# Patient Record
Sex: Female | Born: 1990 | Race: Black or African American | Hispanic: No | Smoking: Never smoker
Health system: Southern US, Community
[De-identification: ages and names within clinical notes are randomized; demographics above are authoritative.]

---

## 2016-10-24 HISTORY — PX: LAPAROSCOPIC UNILATERAL SALPINGECTOMY: SHX5934

## 2018-12-31 ENCOUNTER — Other Ambulatory Visit: Payer: Self-pay

## 2018-12-31 ENCOUNTER — Encounter: Payer: Self-pay | Admitting: Physician Assistant

## 2018-12-31 ENCOUNTER — Emergency Department
Admission: EM | Admit: 2018-12-31 | Discharge: 2019-01-01 | Disposition: A | Discharge status: Home / Self Care | Payer: Self-pay | Attending: Emergency Medicine | Admitting: Emergency Medicine

## 2018-12-31 DIAGNOSIS — M79604 Pain in right leg: Secondary | ICD-10-CM | POA: Insufficient documentation

## 2018-12-31 NOTE — ED Notes (Signed)
Patient reports yesterday around 5pm her left leg went numb.  Patient denies any injury.  Patient reports difficulty walking.  Patient states numbness began when she woke up from a nap.

## 2018-12-31 NOTE — ED Triage Notes (Addendum)
Pt in with co pain to right lower leg states from mid leg down to foot. STates no injury, also co tingling to all right toes. Denies any hx of the same. Pt ambulatory to triage, states pain worse when she walks. Pt in to anterior lower leg, states pain is relieved when she is sitting.

## 2019-01-01 ENCOUNTER — Emergency Department: Payer: Self-pay

## 2019-01-01 LAB — POCT PREGNANCY, URINE: Preg Test, Ur: NEGATIVE

## 2019-01-01 MED ORDER — IBUPROFEN 600 MG PO TABS
600.0000 mg | ORAL_TABLET | Freq: Once | ORAL | Status: AC
  Administered 2019-01-01: 600 mg via ORAL
  Filled 2019-01-01: qty 1

## 2019-01-01 MED ORDER — CYCLOBENZAPRINE HCL 10 MG PO TABS: 10.0000 mg | ORAL_TABLET | Freq: Three times a day (TID) | ORAL | 0 refills | Status: DC | PRN

## 2019-01-01 MED ORDER — IBUPROFEN 800 MG PO TABS: 800.0000 mg | ORAL_TABLET | Freq: Three times a day (TID) | ORAL | 0 refills | Status: DC | PRN

## 2019-01-01 MED ORDER — CYCLOBENZAPRINE HCL 10 MG PO TABS
10.0000 mg | ORAL_TABLET | Freq: Once | ORAL | Status: AC
  Administered 2019-01-01: 10 mg via ORAL
  Filled 2019-01-01: qty 1

## 2019-01-01 NOTE — ED Notes (Signed)
ED Provider at bedside. 

## 2019-01-01 NOTE — ED Provider Notes (Signed)
[facility] Emergency Department Provider Note    First MD Initiated Contact with Patient 12/31/18 2329     (approximate)  I have reviewed the triage vital signs and the nursing notes.   HISTORY  Chief Complaint Leg Pain    HPI Jeanne Tyler is a 28 y.o. female presents to the emergency department with nontraumatic right hip and leg pain patient states current pain score is 8 out of 10.  Patient states that pain is worse with ambulation.  Patient denies any swelling.  Patient denies any chest pain.  Patient denies any shortness of breath.  No known history of DVT or PE.  Patient denies any low back pain.  Patient denies any urine or bowel habit changes.  Patient denies any abdominal pain.        History reviewed. No pertinent past medical history.  There are no active problems to display for this patient.   History reviewed. No pertinent surgical history.  Prior to Admission medications   Medication Sig Start Date End Date Taking? Authorizing Provider  cyclobenzaprine (FLEXERIL) 10 MG tablet Take 1 tablet (10 mg total) by mouth 3 (three) times daily as needed. 01/01/19   Darci Current, MD  ibuprofen (ADVIL,MOTRIN) 800 MG tablet Take 1 tablet (800 mg total) by mouth every 8 (eight) hours as needed. 01/01/19   Darci Current, MD    Allergies Tramadol  No family history on file.  Social History Social History   Tobacco Use  . Smoking status: Not on file  Substance Use Topics  . Alcohol use: Not on file  . Drug use: Not on file    Review of Systems Constitutional: No fever/chills Eyes: No visual changes. ENT: No sore throat. Cardiovascular: Denies chest pain. Respiratory: Denies shortness of breath. Gastrointestinal: No abdominal pain.  No nausea, no vomiting.  No diarrhea.  No constipation. Genitourinary: Negative for dysuria. Musculoskeletal: Negative for neck pain.  Negative for back pain.  Positive for right hip/leg  pain Integumentary: Negative for rash. Neurological: Negative for headaches, focal weakness or numbness.   ____________________________________________   PHYSICAL EXAM:  VITAL SIGNS: ED Triage Vitals  Enc Vitals Group     BP 12/31/18 2118 115/77     Pulse Rate 12/31/18 2118 77     Resp 12/31/18 2118 20     Temp 12/31/18 2118 98 F (36.7 C)     Temp Source 12/31/18 2118 Oral     SpO2 12/31/18 2118 100 %     Weight 12/31/18 2119 58.5 kg (129 lb)     Height 12/31/18 2119 1.524 m (5')     Head Circumference --      Peak Flow --      Pain Score 12/31/18 2119 8     Pain Loc --      Pain Edu? --      Excl. in GC? --     Constitutional: Alert and oriented. Well appearing and in no acute distress. Eyes: Conjunctivae are normal.  Mouth/Throat: Mucous membranes are moist.  Oropharynx non-erythematous. Neck: No stridor.   Cardiovascular: Normal rate, regular rhythm. Good peripheral circulation. Grossly normal heart sounds. Respiratory: Normal respiratory effort.  No retractions. Lungs CTAB. Gastrointestinal: Soft and nontender. No distention.  Musculoskeletal: Right hip pain with palpation.  Pain to palpation anterior right leg and foot. Neurologic:  Normal speech and language. No gross focal neurologic deficits are appreciated.  Skin:  Skin is warm, dry and intact. No rash noted. Psychiatric:  Mood and affect are normal. Speech and behavior are normal.  _______________________  RADIOLOGY I, [place] N BROWN, personally viewed and evaluated these images (plain radiographs) as part of my medical decision making, as well as reviewing the written report by the radiologist.  ED MD interpretation: Ultrasound venous of right lower extremity revealed no evidence of DVT.  Right hip x-ray negative  Official radiology report(s): US Venous Img Lower Unilateral Right  Result Date: 01/01/2019 CLINICAL DATA:  Right leg pain EXAM: Right LOWER EXTREMITY VENOUS DOPPLER ULTRASOUND TECHNIQUE:  Gray-scale sonography with graded compression, as well as color Doppler and duplex ultrasound were performed to evaluate the lower extremity deep venous systems from the level of the common femoral vein and including the common femoral, femoral, profunda femoral, popliteal and calf veins including the posterior tibial, peroneal and gastrocnemius veins when visible. The superficial great saphenous vein was also interrogated. Spectral Doppler was utilized to evaluate flow at rest and with distal augmentation maneuvers in the common femoral, femoral and popliteal veins. COMPARISON:  None. FINDINGS: Contralateral Common Femoral Vein: Respiratory phasicity is normal and symmetric with the symptomatic side. No evidence of thrombus. Normal compressibility. Common Femoral Vein: No evidence of thrombus. Normal compressibility, respiratory phasicity and response to augmentation. Saphenofemoral Junction: No evidence of thrombus. Normal compressibility and flow on color Doppler imaging. Profunda Femoral Vein: No evidence of thrombus. Normal compressibility and flow on color Doppler imaging. Femoral Vein: No evidence of thrombus. Normal compressibility, respiratory phasicity and response to augmentation. Popliteal Vein: No evidence of thrombus. Normal compressibility, respiratory phasicity and response to augmentation. Calf Veins: No evidence of thrombus. Normal compressibility and flow on color Doppler imaging. IMPRESSION: No evidence of deep venous thrombosis. Electronically Signed   By: Jasmine Pang M.D.   On: 01/01/2019 01:14   Dg Hip Unilat W Or Wo Pelvis 2-3 Views Right  Result Date: 01/01/2019 CLINICAL DATA:  Right hip pain EXAM: DG HIP (WITH OR WITHOUT PELVIS) 2-3V RIGHT COMPARISON:  None. FINDINGS: There is no evidence of hip fracture or dislocation. There is no evidence of arthropathy or other focal bone abnormality. IMPRESSION: Negative. Electronically Signed   By: Jasmine Pang M.D.   On: 01/01/2019 01:27       Procedures   ____________________________________________   INITIAL IMPRESSION / MDM / ASSESSMENT AND PLAN / ED COURSE  As part of my medical decision making, I reviewed the following data within the electronic MEDICAL RECORD NUMBER  28 year old female presented with above-stated history and physical exam secondary to right hip/leg pain.  Ultrasound revealed no evidence of DVT x-ray likewise unremarkable consider possibility DVT, muscular strain, bursitis.  Patient given Flexeril and ibuprofen the emergency department with resolution of hip pain. ____________________________________________  FINAL CLINICAL IMPRESSION(S) / ED DIAGNOSES  Final diagnoses:  Right leg pain     MEDICATIONS GIVEN DURING THIS VISIT:  Medications  cyclobenzaprine (FLEXERIL) tablet 10 mg (10 mg Oral Given 01/01/19 0124)  ibuprofen (ADVIL,MOTRIN) tablet 600 mg (600 mg Oral Given 01/01/19 0124)     ED Discharge Orders         Ordered    ibuprofen (ADVIL,MOTRIN) 800 MG tablet  Every 8 hours PRN     01/01/19 0235    cyclobenzaprine (FLEXERIL) 10 MG tablet  3 times daily PRN     01/01/19 0235           Note:  This document was prepared using Dragon voice recognition software and may include unintentional dictation errors.   Darci Current,  MD 01/01/19 0981

## 2019-01-01 NOTE — ED Notes (Signed)
Patient transported to X-ray 

## 2019-09-06 ENCOUNTER — Other Ambulatory Visit: Payer: Self-pay

## 2019-09-06 ENCOUNTER — Emergency Department
Admission: EM | Admit: 2019-09-06 | Discharge: 2019-09-06 | Disposition: A | Discharge status: Home / Self Care | Payer: Medicaid Other | Attending: Emergency Medicine | Admitting: Emergency Medicine

## 2019-09-06 ENCOUNTER — Encounter: Payer: Self-pay | Admitting: Emergency Medicine

## 2019-09-06 DIAGNOSIS — O210 Mild hyperemesis gravidarum: Secondary | ICD-10-CM | POA: Diagnosis not present

## 2019-09-06 DIAGNOSIS — Z3A01 Less than 8 weeks gestation of pregnancy: Secondary | ICD-10-CM | POA: Diagnosis not present

## 2019-09-06 DIAGNOSIS — O219 Vomiting of pregnancy, unspecified: Secondary | ICD-10-CM | POA: Diagnosis present

## 2019-09-06 LAB — COMPREHENSIVE METABOLIC PANEL
ALT: 33 U/L (ref 0–44)
AST: 23 U/L (ref 15–41)
Albumin: 4.2 g/dL (ref 3.5–5.0)
Alkaline Phosphatase: 55 U/L (ref 38–126)
Anion gap: 8 (ref 5–15)
BUN: 11 mg/dL (ref 6–20)
CO2: 23 mmol/L (ref 22–32)
Calcium: 9 mg/dL (ref 8.9–10.3)
Chloride: 102 mmol/L (ref 98–111)
Creatinine, Ser: 0.72 mg/dL (ref 0.44–1.00)
GFR calc Af Amer: >60 mL/min (ref >60)
GFR calc non Af Amer: >60 mL/min (ref >60)
Glucose, Bld: 99 mg/dL (ref 70–99)
Potassium: 3.9 mmol/L (ref 3.5–5.1)
Sodium: 133 mmol/L — ABNORMAL LOW (ref 135–145)
Total Bilirubin: 0.9 mg/dL (ref 0.3–1.2)
Total Protein: 7.8 g/dL (ref 6.5–8.1)

## 2019-09-06 LAB — CBC
HCT: 36.6 % (ref 36.0–46.0)
Hemoglobin: 12.6 g/dL (ref 12.0–15.0)
MCH: 29.9 pg (ref 26.0–34.0)
MCHC: 34.4 g/dL (ref 30.0–36.0)
MCV: 86.7 fL (ref 80.0–100.0)
Platelets: 356 10*3/uL (ref 150–400)
RBC: 4.22 MIL/uL (ref 3.87–5.11)
RDW: 12.6 % (ref 11.5–15.5)
WBC: 9.1 10*3/uL (ref 4.0–10.5)
nRBC: 0 % (ref 0.0–0.2)

## 2019-09-06 LAB — URINALYSIS, COMPLETE (UACMP) WITH MICROSCOPIC
Bacteria, UA: NONE SEEN
Bilirubin Urine: NEGATIVE
Glucose, UA: NEGATIVE mg/dL
Hgb urine dipstick: NEGATIVE
Ketones, ur: 80 mg/dL — AB
Leukocytes,Ua: NEGATIVE
Nitrite: NEGATIVE
Protein, ur: 30 mg/dL — AB
Specific Gravity, Urine: 1.024 (ref 1.005–1.030)
pH: 7 (ref 5.0–8.0)

## 2019-09-06 MED ORDER — PROMETHAZINE HCL 25 MG/ML IJ SOLN
12.5000 mg | Freq: Once | INTRAMUSCULAR | Status: AC
  Administered 2019-09-06: 14:00:00 12.5 mg via INTRAVENOUS
  Filled 2019-09-06: qty 1

## 2019-09-06 MED ORDER — PROMETHAZINE HCL 12.5 MG RE SUPP: 12.5000 mg | Freq: Four times a day (QID) | RECTAL | 0 refills | Status: DC | PRN

## 2019-09-06 MED ORDER — SODIUM CHLORIDE 0.9 % IV SOLN
1000.0000 mL | Freq: Once | INTRAVENOUS | Status: AC
  Administered 2019-09-06: 14:00:00 1000 mL via INTRAVENOUS

## 2019-09-06 NOTE — ED Notes (Signed)
Pt to restroom. Will have sign for d/c paperwork once back.

## 2019-09-06 NOTE — ED Triage Notes (Signed)
Pt reports is [redacted] weeks pregnant and started vomiting yesterday. Pt reports this is her second pregnancy so she knows how it can get.

## 2019-09-06 NOTE — ED Notes (Signed)
EDP Kinner at bedside.  

## 2019-09-06 NOTE — ED Provider Notes (Signed)
[facility] Emergency Department Provider Note   ____________________________________________    I have reviewed the triage vital signs and the nursing notes.   HISTORY  Chief Complaint Emesis During Pregnancy and Emesis     HPI Jeanne Tyler is a 28 y.o. female who reports she is approximately [redacted] weeks pregnant and presents with complaints of nausea and vomiting.  She reports she had significant morning sickness with her first pregnancy which required Phenergan suppositories and IV fluids at times.  She denies abdominal pain.  No vaginal bleeding.  No fevers or chills.  No myalgias.  Complains of nausea and difficulty tolerating p.o.'s.  No dizziness or lightheadedness.  She does not yet know who she will see if her OB  History reviewed. No pertinent past medical history.  There are no active problems to display for this patient.   History reviewed. No pertinent surgical history.  Prior to Admission medications   Medication Sig Start Date End Date Taking? Authorizing Provider  cyclobenzaprine (FLEXERIL) 10 MG tablet Take 1 tablet (10 mg total) by mouth 3 (three) times daily as needed. 01/01/19   Darci Current, MD  ibuprofen (ADVIL,MOTRIN) 800 MG tablet Take 1 tablet (800 mg total) by mouth every 8 (eight) hours as needed. 01/01/19   Darci Current, MD     Allergies Tramadol  No family history on file.  Social History Denies drugs or alcohol Review of Systems  Constitutional: No fever/chills Eyes: No visual changes.  ENT: No sore throat. Cardiovascular: Denies chest pain. Respiratory: Denies shortness of breath. Gastrointestinal: As above Genitourinary: As Musculoskeletal: Negative for back pain. Skin: Negative for rash. Neurological: Negative for headaches or weakness   ____________________________________________   PHYSICAL EXAM:  VITAL SIGNS: ED Triage Vitals  Enc Vitals Group     BP 09/06/19 1258 98/61     Pulse Rate  09/06/19 1258 79     Resp 09/06/19 1258 16     Temp 09/06/19 1258 99.7 F (37.6 C)     Temp Source 09/06/19 1258 Oral     SpO2 09/06/19 1258 100 %     Weight 09/06/19 1249 59.9 kg (132 lb)     Height 09/06/19 1249 1.524 m (5')     Head Circumference --      Peak Flow --      Pain Score 09/06/19 1249 0     Pain Loc --      Pain Edu? --      Excl. in GC? --     Constitutional: Alert and oriented.   Nose: No congestion/rhinnorhea. Mouth/Throat: Mucous membranes are dry  Cardiovascular: Normal rate, regular rhythm. Grossly normal heart sounds.  Good peripheral circulation. Respiratory: Normal respiratory effort.  No retractions. Lungs CTAB. Gastrointestinal: Soft and nontender. No distention.  No CVA tenderness.  Musculoskeletal:  Warm and well perfused Neurologic:  Normal speech and language. No gross focal neurologic deficits are appreciated.  Skin:  Skin is warm, dry and intact. No rash noted. Psychiatric: Mood and affect are normal. Speech and behavior are normal.  ____________________________________________   LABS (all labs ordered are listed, but only abnormal results are displayed)  Labs Reviewed  COMPREHENSIVE METABOLIC PANEL - Abnormal; Notable for the following components:      Result Value   Sodium 133 (*)    All other components within normal limits  URINALYSIS, COMPLETE (UACMP) WITH MICROSCOPIC - Abnormal; Notable for the following components:   Color, Urine YELLOW (*)  APPearance HAZY (*)    Ketones, ur 80 (*)    Protein, ur 30 (*)    All other components within normal limits  CBC   ____________________________________________  EKG  None ____________________________________________  RADIOLOGY  None ____________________________________________   PROCEDURES  Procedure(s) performed: No  Procedures   Critical Care performed: No ____________________________________________   INITIAL IMPRESSION / ASSESSMENT AND PLAN / ED COURSE   Pertinent labs & imaging results that were available during my care of the patient were reviewed by me and considered in my medical decision making (see chart for details).  Patient presents with nausea and vomiting in early pregnancy consistent with morning sickness.  Electrolytes are overall reassuring, we will treat with IV fluids, IV Phenergan.  No indication for imaging at this time.  Overall reassuring exam.  Anticipate discharge with Phenergan suppositories and outpatient follow-up with GYN    ____________________________________________   FINAL CLINICAL IMPRESSION(S) / ED DIAGNOSES  Final diagnoses:  Morning sickness        Note:  This document was prepared using Dragon voice recognition software and may include unintentional dictation errors.   Lavonia Drafts, MD 09/06/19 1350

## 2019-09-06 NOTE — ED Notes (Signed)
Pt accidentally signed under this RN's witness signature spot.

## 2019-09-06 NOTE — ED Provider Notes (Signed)
Patient resting comfortably, no distress.  Completing IV fluids, appears well ambulatory without distress.  Appropriate for discharge, provided prescription for Phenergan which she is utilized in the past   Delman Kitten, MD 09/06/19 1515

## 2019-10-03 ENCOUNTER — Encounter
Admission: EM | Disposition: A | Discharge status: Home / Self Care | Payer: Self-pay | Source: Home / Self Care | Attending: Emergency Medicine

## 2019-10-03 ENCOUNTER — Emergency Department: Payer: Medicaid Other | Admitting: Certified Registered"

## 2019-10-03 ENCOUNTER — Emergency Department: Payer: Medicaid Other

## 2019-10-03 ENCOUNTER — Encounter: Payer: Self-pay | Admitting: Intensive Care

## 2019-10-03 ENCOUNTER — Ambulatory Visit
Admission: EM | Admit: 2019-10-03 | Discharge: 2019-10-03 | Disposition: A | Discharge status: Home / Self Care | Payer: Medicaid Other | Attending: Emergency Medicine | Admitting: Emergency Medicine

## 2019-10-03 ENCOUNTER — Other Ambulatory Visit: Payer: Self-pay

## 2019-10-03 DIAGNOSIS — N8311 Corpus luteum cyst of right ovary: Secondary | ICD-10-CM | POA: Insufficient documentation

## 2019-10-03 DIAGNOSIS — O008 Other ectopic pregnancy without intrauterine pregnancy: Secondary | ICD-10-CM | POA: Diagnosis not present

## 2019-10-03 DIAGNOSIS — K661 Hemoperitoneum: Secondary | ICD-10-CM | POA: Insufficient documentation

## 2019-10-03 DIAGNOSIS — Z79899 Other long term (current) drug therapy: Secondary | ICD-10-CM | POA: Insufficient documentation

## 2019-10-03 DIAGNOSIS — O00109 Unspecified tubal pregnancy without intrauterine pregnancy: Secondary | ICD-10-CM | POA: Insufficient documentation

## 2019-10-03 DIAGNOSIS — Z3A09 9 weeks gestation of pregnancy: Secondary | ICD-10-CM | POA: Insufficient documentation

## 2019-10-03 DIAGNOSIS — R109 Unspecified abdominal pain: Secondary | ICD-10-CM | POA: Diagnosis present

## 2019-10-03 DIAGNOSIS — O26891 Other specified pregnancy related conditions, first trimester: Secondary | ICD-10-CM

## 2019-10-03 DIAGNOSIS — O3481 Maternal care for other abnormalities of pelvic organs, first trimester: Secondary | ICD-10-CM | POA: Diagnosis not present

## 2019-10-03 DIAGNOSIS — O009 Unspecified ectopic pregnancy without intrauterine pregnancy: Secondary | ICD-10-CM

## 2019-10-03 DIAGNOSIS — Z20828 Contact with and (suspected) exposure to other viral communicable diseases: Secondary | ICD-10-CM | POA: Diagnosis not present

## 2019-10-03 DIAGNOSIS — O00101 Right tubal pregnancy without intrauterine pregnancy: Secondary | ICD-10-CM | POA: Insufficient documentation

## 2019-10-03 DIAGNOSIS — Z9889 Other specified postprocedural states: Secondary | ICD-10-CM

## 2019-10-03 HISTORY — PX: LAPAROSCOPY: SHX197

## 2019-10-03 HISTORY — PX: LAPAROSCOPIC UNILATERAL SALPINGECTOMY: SHX5934

## 2019-10-03 LAB — COMPREHENSIVE METABOLIC PANEL
ALT: 12 U/L (ref 0–44)
AST: 17 U/L (ref 15–41)
Albumin: 3.4 g/dL — ABNORMAL LOW (ref 3.5–5.0)
Alkaline Phosphatase: 41 U/L (ref 38–126)
Anion gap: 9 (ref 5–15)
BUN: 15 mg/dL (ref 6–20)
CO2: 23 mmol/L (ref 22–32)
Calcium: 8.7 mg/dL — ABNORMAL LOW (ref 8.9–10.3)
Chloride: 103 mmol/L (ref 98–111)
Creatinine, Ser: 0.66 mg/dL (ref 0.44–1.00)
GFR calc Af Amer: >60 mL/min (ref >60)
GFR calc non Af Amer: >60 mL/min (ref >60)
Glucose, Bld: 129 mg/dL — ABNORMAL HIGH (ref 70–99)
Potassium: 3.5 mmol/L (ref 3.5–5.1)
Sodium: 135 mmol/L (ref 135–145)
Total Bilirubin: 0.8 mg/dL (ref 0.3–1.2)
Total Protein: 7 g/dL (ref 6.5–8.1)

## 2019-10-03 LAB — CBC WITH DIFFERENTIAL/PLATELET
Abs Immature Granulocytes: 0.1 10*3/uL — ABNORMAL HIGH (ref 0.00–0.07)
Basophils Absolute: 0 10*3/uL (ref 0.0–0.1)
Basophils Relative: 0 %
Eosinophils Absolute: 0 10*3/uL (ref 0.0–0.5)
Eosinophils Relative: 0 %
HCT: 32 % — ABNORMAL LOW (ref 36.0–46.0)
Hemoglobin: 11.3 g/dL — ABNORMAL LOW (ref 12.0–15.0)
Immature Granulocytes: 1 %
Lymphocytes Relative: 15 %
Lymphs Abs: 2.7 10*3/uL (ref 0.7–4.0)
MCH: 30 pg (ref 26.0–34.0)
MCHC: 35.3 g/dL (ref 30.0–36.0)
MCV: 84.9 fL (ref 80.0–100.0)
Monocytes Absolute: 0.9 10*3/uL (ref 0.1–1.0)
Monocytes Relative: 5 %
Neutro Abs: 14.3 10*3/uL — ABNORMAL HIGH (ref 1.7–7.7)
Neutrophils Relative %: 79 %
Platelets: 377 10*3/uL (ref 150–400)
RBC: 3.77 MIL/uL — ABNORMAL LOW (ref 3.87–5.11)
RDW: 12.3 % (ref 11.5–15.5)
WBC: 18 10*3/uL — ABNORMAL HIGH (ref 4.0–10.5)
nRBC: 0 % (ref 0.0–0.2)

## 2019-10-03 LAB — SAMPLE TO BLOOD BANK

## 2019-10-03 LAB — RESPIRATORY PANEL BY RT PCR (FLU A&B, COVID)
Influenza A by PCR: NEGATIVE
Influenza B by PCR: NEGATIVE
SARS Coronavirus 2 by RT PCR: NEGATIVE

## 2019-10-03 LAB — ABO/RH: ABO/RH(D): B NEG

## 2019-10-03 LAB — HCG, QUANTITATIVE, PREGNANCY: hCG, Beta Chain, Quant, S: 183918 m[IU]/mL — ABNORMAL HIGH (ref <5)

## 2019-10-03 SURGERY — LAPAROSCOPY, DIAGNOSTIC
Anesthesia: General | Site: Abdomen | Laterality: Right

## 2019-10-03 MED ORDER — ONDANSETRON HCL 4 MG/2ML IJ SOLN
INTRAMUSCULAR | Status: DC | PRN
  Administered 2019-10-03: 4 mg via INTRAVENOUS

## 2019-10-03 MED ORDER — OXYCODONE-ACETAMINOPHEN 5-325 MG PO TABS: 1.0000 | ORAL_TABLET | ORAL | 0 refills | Status: DC | PRN

## 2019-10-03 MED ORDER — FENTANYL CITRATE (PF) 100 MCG/2ML IJ SOLN
INTRAMUSCULAR | Status: DC | PRN
  Administered 2019-10-03 (×2): 50 ug via INTRAVENOUS

## 2019-10-03 MED ORDER — MORPHINE SULFATE (PF) 4 MG/ML IV SOLN
4.0000 mg | Freq: Once | INTRAVENOUS | Status: AC
  Administered 2019-10-03: 4 mg via INTRAVENOUS
  Filled 2019-10-03: qty 1

## 2019-10-03 MED ORDER — OXYCODONE-ACETAMINOPHEN 5-325 MG PO TABS
ORAL_TABLET | ORAL | Status: AC
  Administered 2019-10-03: 1 via ORAL
  Filled 2019-10-03: qty 1

## 2019-10-03 MED ORDER — ONDANSETRON HCL 4 MG/2ML IJ SOLN
INTRAMUSCULAR | Status: AC
  Administered 2019-10-03: 4 mg via INTRAVENOUS
  Filled 2019-10-03: qty 2

## 2019-10-03 MED ORDER — PROPOFOL 10 MG/ML IV BOLUS
INTRAVENOUS | Status: DC | PRN
  Administered 2019-10-03: 150 mg via INTRAVENOUS

## 2019-10-03 MED ORDER — HEMOSTATIC AGENTS (NO CHARGE) OPTIME
TOPICAL | Status: DC | PRN
  Administered 2019-10-03: 1

## 2019-10-03 MED ORDER — MIDAZOLAM HCL 2 MG/2ML IJ SOLN
INTRAMUSCULAR | Status: DC | PRN
  Administered 2019-10-03: 2 mg via INTRAVENOUS

## 2019-10-03 MED ORDER — RHO D IMMUNE GLOBULIN 1500 UNIT/2ML IJ SOSY
300.0000 ug | PREFILLED_SYRINGE | Freq: Once | INTRAMUSCULAR | Status: AC
  Administered 2019-10-03: 300 ug via INTRAMUSCULAR
  Filled 2019-10-03: qty 2

## 2019-10-03 MED ORDER — SODIUM CHLORIDE 0.9% IV SOLUTION
Freq: Once | INTRAVENOUS | Status: DC
  Filled 2019-10-03: qty 250

## 2019-10-03 MED ORDER — LACTATED RINGERS IV SOLN
INTRAVENOUS | Status: DC | PRN
  Administered 2019-10-03: 18:00:00 via INTRAVENOUS

## 2019-10-03 MED ORDER — PHENYLEPHRINE HCL (PRESSORS) 10 MG/ML IV SOLN
INTRAVENOUS | Status: DC | PRN
  Administered 2019-10-03 (×2): 100 ug via INTRAVENOUS

## 2019-10-03 MED ORDER — ACETAMINOPHEN 10 MG/ML IV SOLN
INTRAVENOUS | Status: DC | PRN
  Administered 2019-10-03: 1000 mg via INTRAVENOUS

## 2019-10-03 MED ORDER — SUGAMMADEX SODIUM 200 MG/2ML IV SOLN
INTRAVENOUS | Status: DC | PRN
  Administered 2019-10-03: 130 mg via INTRAVENOUS

## 2019-10-03 MED ORDER — FENTANYL CITRATE (PF) 100 MCG/2ML IJ SOLN
25.0000 ug | INTRAMUSCULAR | Status: DC | PRN
  Administered 2019-10-03 (×4): 25 ug via INTRAVENOUS

## 2019-10-03 MED ORDER — OXYCODONE-ACETAMINOPHEN 5-325 MG PO TABS: 1.0000 | ORAL_TABLET | ORAL | Status: DC | PRN

## 2019-10-03 MED ORDER — LIDOCAINE HCL (CARDIAC) PF 100 MG/5ML IV SOSY
PREFILLED_SYRINGE | INTRAVENOUS | Status: DC | PRN
  Administered 2019-10-03: 100 mg via INTRAVENOUS

## 2019-10-03 MED ORDER — ROCURONIUM BROMIDE 100 MG/10ML IV SOLN
INTRAVENOUS | Status: DC | PRN
  Administered 2019-10-03: 40 mg via INTRAVENOUS

## 2019-10-03 MED ORDER — BUPIVACAINE HCL 0.5 % IJ SOLN
INTRAMUSCULAR | Status: DC | PRN
  Administered 2019-10-03: 5 mL
  Administered 2019-10-03: 9 mL

## 2019-10-03 MED ORDER — SODIUM CHLORIDE 0.9 % IV SOLN
1000.0000 mL | Freq: Once | INTRAVENOUS | Status: AC
  Administered 2019-10-03: 1000 mL via INTRAVENOUS

## 2019-10-03 MED ORDER — DEXAMETHASONE SODIUM PHOSPHATE 10 MG/ML IJ SOLN
INTRAMUSCULAR | Status: DC | PRN
  Administered 2019-10-03: 10 mg via INTRAVENOUS

## 2019-10-03 MED ORDER — ONDANSETRON HCL 4 MG/2ML IJ SOLN
4.0000 mg | Freq: Once | INTRAMUSCULAR | Status: AC
  Administered 2019-10-03: 4 mg via INTRAVENOUS
  Filled 2019-10-03: qty 2

## 2019-10-03 MED ORDER — MORPHINE SULFATE (PF) 4 MG/ML IV SOLN
4.0000 mg | Freq: Once | INTRAVENOUS | Status: AC
  Administered 2019-10-03: 2 mg via INTRAVENOUS
  Filled 2019-10-03: qty 1

## 2019-10-03 MED ORDER — ONDANSETRON HCL 4 MG/2ML IJ SOLN: 4.0000 mg | Freq: Once | INTRAMUSCULAR | Status: AC | PRN

## 2019-10-03 SURGICAL SUPPLY — 40 items
ANCHOR TIS RET SYS 235ML (MISCELLANEOUS) ×4 IMPLANT
BLADE SURG SZ11 CARB STEEL (BLADE) ×4 IMPLANT
CANISTER SUCT 1200ML W/VALVE (MISCELLANEOUS) ×4 IMPLANT
CATH ROBINSON RED A/P 16FR (CATHETERS) ×4 IMPLANT
CHLORAPREP W/TINT 26 (MISCELLANEOUS) ×4 IMPLANT
COVER WAND RF STERILE (DRAPES) ×8 IMPLANT
DERMABOND ADVANCED (GAUZE/BANDAGES/DRESSINGS) ×2
DERMABOND ADVANCED .7 DNX12 (GAUZE/BANDAGES/DRESSINGS) ×2 IMPLANT
DEVICE SUTURE ENDOST 10MM (ENDOMECHANICALS) ×4 IMPLANT
DRESSING SURGICEL FIBRLLR 1X2 (HEMOSTASIS) ×2 IMPLANT
DRSG SURGICEL FIBRILLAR 1X2 (HEMOSTASIS) ×4
DRSG TEGADERM 2-3/8X2-3/4 SM (GAUZE/BANDAGES/DRESSINGS) ×4 IMPLANT
GLOVE BIO SURGEON STRL SZ7 (GLOVE) ×4 IMPLANT
GLOVE INDICATOR 7.5 STRL GRN (GLOVE) ×4 IMPLANT
GOWN STRL REUS W/ TWL LRG LVL3 (GOWN DISPOSABLE) ×4 IMPLANT
GOWN STRL REUS W/TWL LRG LVL3 (GOWN DISPOSABLE) ×4
GRASPER SUT TROCAR 14GX15 (MISCELLANEOUS) ×4 IMPLANT
IRRIGATION STRYKERFLOW (MISCELLANEOUS) ×2 IMPLANT
IRRIGATOR STRYKERFLOW (MISCELLANEOUS) ×4
IV LACTATED RINGERS 1000ML (IV SOLUTION) ×4 IMPLANT
KIT PINK PAD W/HEAD ARE REST (MISCELLANEOUS) ×4
KIT PINK PAD W/HEAD ARM REST (MISCELLANEOUS) ×2 IMPLANT
KIT TURNOVER CYSTO (KITS) ×4 IMPLANT
LABEL OR SOLS (LABEL) ×4 IMPLANT
NS IRRIG 500ML POUR BTL (IV SOLUTION) ×4 IMPLANT
PACK GYN LAPAROSCOPIC (MISCELLANEOUS) ×4 IMPLANT
PAD OB MATERNITY 4.3X12.25 (PERSONAL CARE ITEMS) ×4 IMPLANT
PAD PREP 24X41 OB/GYN DISP (PERSONAL CARE ITEMS) ×4 IMPLANT
SCISSORS METZENBAUM CVD 33 (INSTRUMENTS) IMPLANT
SET TUBE SMOKE EVAC HIGH FLOW (TUBING) IMPLANT
SHEARS HARMONIC ACE PLUS 36CM (ENDOMECHANICALS) ×4 IMPLANT
SLEEVE ENDOPATH XCEL 5M (ENDOMECHANICALS) ×4 IMPLANT
SPONGE GAUZE 2X2 8PLY STER LF (GAUZE/BANDAGES/DRESSINGS) ×1
SPONGE GAUZE 2X2 8PLY STRL LF (GAUZE/BANDAGES/DRESSINGS) ×3 IMPLANT
SUT ENDO VLOC 180-0-8IN (SUTURE) ×4 IMPLANT
SUT MNCRL AB 4-0 PS2 18 (SUTURE) ×4 IMPLANT
SUT VIC AB 0 CT2 27 (SUTURE) ×4 IMPLANT
SYR 10ML LL (SYRINGE) ×4 IMPLANT
TROCAR ENDO BLADELESS 11MM (ENDOMECHANICALS) ×4 IMPLANT
TROCAR XCEL NON-BLD 5MMX100MML (ENDOMECHANICALS) ×4 IMPLANT

## 2019-10-03 NOTE — Discharge Instructions (Signed)

## 2019-10-03 NOTE — ED Notes (Signed)
Pt already removed all jewelry earlier but reminded to remove it again if any replaced. Pt's belongings in bag in bed with her. Pt in gown only with one blanket. Pt on phone with family again. Transport at bedside to take pt to OR. Given blank consent form and pt stickers.

## 2019-10-03 NOTE — ED Notes (Signed)
Pt agreeable to go to room 41 with Amber, RN at this time. Another family member noted to be angry in the lobby stating, "we will put her back on the ambulance and take her to my hospital..." Pt taken to room 41 by [employer], RN.

## 2019-10-03 NOTE — Op Note (Signed)
Preoperative Diagnosis: 1) 28 y.o. 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 G1P0  withpresenting with abdominal pain at [redacted] weeks gestation.  MRI showing hemoperitoneum and heterotopic pregnancy  Surgeon: Malachy Mood, MD  Assistant: Prentice Docker, MD this surgery required a high level surgical assistant with none other readily available  Anesthesia: General  Preoperative Antibiotics: none  Estimated Blood Loss: 500 mL  IV Fluids: 1L  Urine Output:: 285mL  Drains or Tubes: none  Implants: none  Specimens Removed: none  Complications: none  Intraoperative Findings: Absent left tube, normal left ovary.  Uterus enlarged consistent with [redacted] weeks gestation.  The right cornual portion of the tube contained a large ruptured ectopic pregnancy.  The ectopic pregnancy was primarily within the proximal portion of the tube but some myometrium had to be excised to fully remove the ectopic pregnancy intact.  The right ovary contained a corpus luteum cyst.  The distal portion of the tube was left in place should the intrauterine pregnancy fail and the patient express interest in attempting reanastomosis.      Patient Condition: stable  Procedure in Detail:  Patient was taken to the operating room where she was administered general anesthesia.  She was positioned in the dorsal lithotomy position utilizing Allen stirups, prepped and draped in the usual sterile fashion.  Prior to proceeding with procedure a time out was performed.  Attention was turned to the patient's pelvis.  A red rubber catheter was used to empty the patient's bladder.    Attention was turned to the patient's abdomen.  The umbilicus was infiltrated with 1% Sensorcaine, before making a stab incision using  an 11 blade scalpel.  A 75mm Excel trocar was then used to gain direct entry into the peritoneal cavity utilizing the camera to visualize progress of the trocar during placement.  Once peritoneal entry had been achieved, insufflation was started and pneumoperitoneum established at a pressure of 40mmHg. Right and left lower quadrant 56mm  trocars were placed under direct visualization and the umbilical trocar was stepped up to an 54mm trocar. General inspection of the abdomen revealed the above noted findings.   Hemoperitoneum was evacuated from the lower and upper abdomen yielding approximately 552mL.  The cornual portion of the ectopic was carefully excised using a 68mm Harmonic scalpel.  Vasopressin was not utilized given the other viable intrauterine pregnancy.  The portion of tubes containing the ectopic was excised from its attachments to the uterus, mesosalpinx, and distal tube, placed in an endocatch bag and removed.  A running locked 0 V-lock suture was used to oversow the uterine cornua.  The pelvis was irrigated noted to be hemostatic  Fibrillar was applied to the cornual pedicle.The 79mm trocar site was closed using a 0 Vicryl and carter thompson.    Pneumoperitoneum was evacuated.  The trocars were removed.  The 64mm trocar site was closed with 4-0 Monocryl in a subcuticular fashion.  All trocar sites were then dressed with surgical skin glue.   Sponge needle and instrument counts were correct time two.  The patient tolerated the procedure well and was taken to the recovery room in stable condition.

## 2019-10-03 NOTE — Anesthesia Procedure Notes (Signed)
Procedure Name: Intubation Date/Time: 10/03/2019 6:10 PM Performed by: Aline Brochure, CRNA Pre-anesthesia Checklist: Patient identified, Emergency Drugs available, Suction available and Patient being monitored Patient Re-evaluated:Patient Re-evaluated prior to induction Oxygen Delivery Method: Circle system utilized Preoxygenation: Pre-oxygenation with 100% oxygen Induction Type: IV induction Ventilation: Mask ventilation without difficulty Laryngoscope Size: Mac and 3 Grade View: Grade II Tube type: Oral Tube size: 7.0 mm Number of attempts: 1 Airway Equipment and Method: Stylet Placement Confirmation: ETT inserted through vocal cords under direct vision,  positive ETCO2 and breath sounds checked- equal and bilateral Secured at: 20 cm Tube secured with: Tape Dental Injury: Teeth and Oropharynx as per pre-operative assessment

## 2019-10-03 NOTE — Transfer of Care (Signed)
Immediate Anesthesia Transfer of Care Note  Patient: Jeanne Tyler  Procedure(s) Performed: LAPAROSCOPY DIAGNOSTIC (N/A Abdomen) LAPAROSCOPIC UNILATERAL SALPINGECTOMY (Right Abdomen)  Patient Location: PACU  Anesthesia Type:General  Level of Consciousness: awake  Airway & Oxygen Therapy: Patient connected to face mask oxygen  Post-op Assessment: Post -op Vital signs reviewed and stable  Post vital signs: stable  Last Vitals:  Vitals Value Taken Time  BP 120/90 10/03/19 1941  Temp    Pulse 103 10/03/19 1942  Resp 20 10/03/19 1942  SpO2 100 % 10/03/19 1942  Vitals shown include unvalidated device data.  Last Pain:  Vitals:   10/03/19 1613  TempSrc:   PainSc: 9          Complications: No apparent anesthesia complications

## 2019-10-03 NOTE — ED Notes (Signed)
EDP Williams at bedside updating pt.

## 2019-10-03 NOTE — ED Notes (Signed)
Extra pink and lav tubes sent to lab in case needed for Type and Screen. Covid swab collected and give to lab staff. Blood consent obtained. Pt called family to update them.

## 2019-10-03 NOTE — ED Notes (Signed)
First Nurse Note:  Pt in via [facility] from home; pt is approximately [redacted] weeks pregnant with complaints of lower abdominal pain, denies vaginal bleeding.  Report hx of Ectopic pregnancy.  BP 96/48 per EMS, other vitals WDL.  22G to right hand upon arrival.

## 2019-10-03 NOTE — ED Notes (Signed)
MRI staff at bedside. Pt reports inc pain again and is requesting more pain meds. Provider notified.

## 2019-10-03 NOTE — Anesthesia Preprocedure Evaluation (Signed)
Anesthesia Evaluation  Patient identified by MRN, date of birth, ID band Patient awake    Reviewed: Allergy & Precautions, NPO status , Patient's Chart, lab work & pertinent test results  History of Anesthesia Complications Negative for: history of anesthetic complications  Airway Mallampati: II       Dental   Pulmonary neg sleep apnea, neg COPD, Not current smoker,           Cardiovascular (-) hypertension(-) Past MI and (-) CHF (-) dysrhythmias (-) Valvular Problems/Murmurs     Neuro/Psych neg Seizures    GI/Hepatic Neg liver ROS, neg GERD  ,  Endo/Other  neg diabetes  Renal/GU negative Renal ROS     Musculoskeletal   Abdominal   Peds  Hematology   Anesthesia Other Findings   Reproductive/Obstetrics (+) Pregnancy                             Anesthesia Physical Anesthesia Plan  ASA: II and emergent  Anesthesia Plan: General   Post-op Pain Management:    Induction: Intravenous  PONV Risk Score and Plan: 3 and Dexamethasone, Ondansetron and Midazolam  Airway Management Planned: Oral ETT  Additional Equipment:   Intra-op Plan:   Post-operative Plan:   Informed Consent: I have reviewed the patients History and Physical, chart, labs and discussed the procedure including the risks, benefits and alternatives for the proposed anesthesia with the patient or authorized representative who has indicated his/her understanding and acceptance.       Plan Discussed with:   Anesthesia Plan Comments:         Anesthesia Quick Evaluation

## 2019-10-03 NOTE — ED Notes (Signed)
Pt off unit at imaging. Will give pain meds once back.

## 2019-10-03 NOTE — H&P (Signed)
Obstetrics & Gynecology Consult H&P   Consulting Department: Emergency Department  Consulting Physician: Daryel November, MD  Consulting Question: Ruptured heterotopic   History of Present Illness: Patient is a 28 y.o. G3P1011 with Patient's last menstrual period was 07/24/2019 (exact date). presenting with abdominal pain 24-hrs.  The patient has had problems with hyperemesis.  She threw up this morning and felt a sudden onset of right lower quadrant pain that steadily worsened.  The pain worried her for ectopic pregnancy.  Patient previously underwent term cesarean section 8 years ago, followed by left salpingectomy for ectopic pregnancy at [facility].    Initial ultrasound in ED revealed a viable intrauterine pregnancy at [redacted]w[redacted]d gestation,  No other concerning findings were noted at that time.  However, because of continued severe abdominal pain requiring IV antibiotics MRI pelvis was obtained showing large volume of hemoperitoneum and what appears to be cystic right adnexa likely representing a corpus luteum given gestational age, however inferior to this is a second mass appearing to contain a gestational sac and fetal part, separate from the IUP, tubal but potentially cornual.     Review of Systems:10 point review of systems  Past Medical History:  History reviewed. No pertinent past medical history.  Past Surgical History:  History reviewed. No pertinent surgical history.  Gynecologic History:   Obstetric History: G1P0  Family History:  History reviewed. No pertinent family history.  Social History:  Social History   Socioeconomic History  . Marital status: Single    Spouse name: Not on file  . Number of children: Not on file  . Years of education: Not on file  . Highest education level: Not on file  Occupational History  . Not on file  Tobacco Use  . Smoking status: Never Smoker  . Smokeless tobacco: Never Used  Substance and Sexual Activity  . Alcohol use: Not  Currently  . Drug use: Never  . Sexual activity: Not on file  Other Topics Concern  . Not on file  Social History Narrative  . Not on file   Social Determinants of Health   Financial Resource Strain:   . Difficulty of Paying Living Expenses: Not on file  Food Insecurity:   . Worried About Programme researcher, broadcasting/film/video in the Last Year: Not on file  . Ran Out of Food in the Last Year: Not on file  Transportation Needs:   . Lack of Transportation (Medical): Not on file  . Lack of Transportation (Non-Medical): Not on file  Physical Activity:   . Days of Exercise per Week: Not on file  . Minutes of Exercise per Session: Not on file  Stress:   . Feeling of Stress : Not on file  Social Connections:   . Frequency of Communication with Friends and Family: Not on file  . Frequency of Social Gatherings with Friends and Family: Not on file  . Attends Religious Services: Not on file  . Active Member of Clubs or Organizations: Not on file  . Attends Banker Meetings: Not on file  . Marital Status: Not on file  Intimate Partner Violence:   . Fear of Current or Ex-Partner: Not on file  . Emotionally Abused: Not on file  . Physically Abused: Not on file  . Sexually Abused: Not on file    Allergies:  Allergies  Allergen Reactions  . Tramadol Swelling    Medications: Prior to Admission medications   Medication Sig Start Date End Date Taking? Authorizing Provider  cyclobenzaprine (  FLEXERIL) 10 MG tablet Take 1 tablet (10 mg total) by mouth 3 (three) times daily as needed. Patient not taking: Reported on 10/03/2019 01/01/19   Darci Current, MD  DICLEGIS 10-10 MG TBEC Take 1-2 tablets by mouth at bedtime as needed.  09/27/19   [provider]  ibuprofen (ADVIL,MOTRIN) 800 MG tablet Take 1 tablet (800 mg total) by mouth every 8 (eight) hours as needed. Patient not taking: Reported on 10/03/2019 01/01/19   Darci Current, MD  promethazine (PHENERGAN) 12.5 MG suppository  Place 1 suppository (12.5 mg total) rectally every 6 (six) hours as needed for nausea or vomiting. 09/06/19   Jene Every, MD    Physical Exam Vitals: Blood pressure 108/66, pulse 71, temperature 98 F (36.7 C), temperature source Oral, resp. rate 16, height 5' (1.524 m), weight 61.2 kg, last menstrual period 07/24/2019, SpO2 100 %. General: NAD HEENT: normocephalic, anicteric Pulmonary: No increased work of breathing, CTAB Cardiovascular: RRR, distal pulses 2+ Abdomen: distended, diffusely tender, + rebound and guarding Genitourinary: deferred Extremities: no edema, erythema, or tenderness Neurologic: Grossly intact Psychiatric: mood appropriate, affect full  Labs: Results for orders placed or performed during the hospital encounter of 10/03/19 (from the past 72 hour(s))  hCG, quantitative, pregnancy     Status: Abnormal   Collection Time: 10/03/19 11:55 AM  Result Value Ref Range   hCG, Beta Chain, Quant, S 183,918 (H) <5 mIU/mL    Comment:          GEST. AGE      CONC.  (mIU/mL)   <=1 WEEK        5 - 50     2 WEEKS       50 - 500     3 WEEKS       100 - 10,000     4 WEEKS     1,000 - 30,000     5 WEEKS     3,500 - 115,000   6-8 WEEKS     12,000 - 270,000    12 WEEKS     15,000 - 220,000        FEMALE AND NON-PREGNANT FEMALE:     LESS THAN 5 mIU/mL Performed at [facility] [address] [place], [place] [address]   CBC with Differential     Status: Abnormal   Collection Time: 10/03/19 11:57 AM  Result Value Ref Range   WBC 18.0 (H) 4.0 - 10.5 K/uL   RBC 3.77 (L) 3.87 - 5.11 MIL/uL   Hemoglobin 11.3 (L) 12.0 - 15.0 g/dL   HCT 52.8 (L) 41.3 - 24.4 %   MCV 84.9 80.0 - 100.0 fL   MCH 30.0 26.0 - 34.0 pg   MCHC 35.3 30.0 - 36.0 g/dL   RDW 01.0 27.2 - 53.6 %   Platelets 377 150 - 400 K/uL   nRBC 0.0 0.0 - 0.2 %   Neutrophils Relative % 79 %   Neutro Abs 14.3 (H) 1.7 - 7.7 K/uL   Lymphocytes Relative 15 %   Lymphs Abs 2.7 0.7 - 4.0 K/uL   Monocytes  Relative 5 %   Monocytes Absolute 0.9 0.1 - 1.0 K/uL   Eosinophils Relative 0 %   Eosinophils Absolute 0.0 0.0 - 0.5 K/uL   Basophils Relative 0 %   Basophils Absolute 0.0 0.0 - 0.1 K/uL   Immature Granulocytes 1 %   Abs Immature Granulocytes 0.10 (H) 0.00 - 0.07 K/uL    Comment: Performed at [employer]  [facility], [address] [place], [place] [address]  Comprehensive metabolic panel     Status: Abnormal   Collection Time: 10/03/19 11:57 AM  Result Value Ref Range   Sodium 135 135 - 145 mmol/L   Potassium 3.5 3.5 - 5.1 mmol/L   Chloride 103 98 - 111 mmol/L   CO2 23 22 - 32 mmol/L   Glucose, Bld 129 (H) 70 - 99 mg/dL   BUN 15 6 - 20 mg/dL   Creatinine, Ser 2.59 0.44 - 1.00 mg/dL   Calcium 8.7 (L) 8.9 - 10.3 mg/dL   Total Protein 7.0 6.5 - 8.1 g/dL   Albumin 3.4 (L) 3.5 - 5.0 g/dL   AST 17 15 - 41 U/L   ALT 12 0 - 44 U/L   Alkaline Phosphatase 41 38 - 126 U/L   Total Bilirubin 0.8 0.3 - 1.2 mg/dL   GFR calc non Af Amer >60 >60 mL/min   GFR calc Af Amer >60 >60 mL/min   Anion gap 9 5 - 15    Comment: Performed at [facility], [address], [place], [place] [address]  Sample to Blood Bank     Status: None   Collection Time: 10/03/19 12:29 PM  Result Value Ref Range   Blood Bank Specimen SAMPLE AVAILABLE FOR TESTING    Sample Expiration      10/06/2019,2359 Performed at [facility], [address] [place], [place] [address]   Prepare RBC     Status: None (Preliminary result)   Collection Time: 10/03/19  4:44 PM  Result Value Ref Range   Order Confirmation      NO CURRENT SAMPLE, MUST ORDER TYPE AND SCREEN Performed at [facility], [address] [place], [place] [address]     Imaging MR PELVIS WO CONTRAST  Addendum Date: 10/03/2019   ADDENDUM REPORT: 10/03/2019 16:54 ADDENDUM: For clarification there are 2 cystic areas in the right adnexa, the more inferior of these closest to the right uterus represents the heterotopic  pregnancy. The other is a cyst referenced on the previous ultrasound showing simple internal characteristics on the current study. Electronically Signed   By: Donzetta Kohut M.D.   On: 10/03/2019 16:54   Result Date: 10/03/2019 CLINICAL DATA:  Concern for acute abdominal process in the setting of pregnancy. EXAM: MRI ABDOMEN WITHOUT CONTRAST TECHNIQUE: Multiplanar multisequence MR imaging was performed without the administration of intravenous contrast. COMPARISON:  Ob ultrasound same date FINDINGS: Lower chest: Incidental imaging of the lung bases is unremarkable. Hepatobiliary: Normal appearance of the liver and gallbladder. Pancreas: No mass, inflammatory changes, or other parenchymal abnormality identified. Spleen:  Normal spleen Adrenals/Urinary Tract: Normal adrenal glands. Smooth contour bilateral kidneys no signs of hydronephrosis. Stomach/Bowel: Limited assessment of bowel. No gross signs of acute bowel process. There is diffuse fluid that surrounds bowel loops showing complexity particularly in the pelvis compatible with hemoperitoneum based on intermediate to high T1 signal and heterogeneous T2 signal, see below for further detail. Appendix is not visualized. Vascular/Lymphatic: Abdominal vasculature, grossly patent, not well evaluated without contrast. Reproductive: Intrauterine pregnancy as seen on the recent ultrasound. In the right adnexa just adjacent to the uterus is a 3.6 x 3.3 cm area that shows signs of decidual reaction and internal complexity. These are findings of heterotopic pregnancy adjacent to right ovarian cyst. The left ovary is normal. Musculoskeletal: No sign of acute or destructive bone process. IMPRESSION: Signs of ruptured heterotopic pregnancy in the right adnexa with large volume  hemoperitoneum. Also with signs of intrauterine pregnancy as shown on recent sonogram. Limited assessment of bowel to include the appendix due to large volume hemoperitoneum. Critical Value/emergent  results were called by telephone at the time of interpretation on 10/03/2019 at 4:25 pm to [facility] , who verbally acknowledged these results. Electronically Signed: By: Donzetta KohutGeoffrey  Wile M.D. On: 10/03/2019 16:44   MR ABDOMEN WO CONTRAST  Addendum Date: 10/03/2019   ADDENDUM REPORT: 10/03/2019 16:54 ADDENDUM: For clarification there are 2 cystic areas in the right adnexa, the more inferior of these closest to the right uterus represents the heterotopic pregnancy. The other is a cyst referenced on the previous ultrasound showing simple internal characteristics on the current study. Electronically Signed   By: Donzetta KohutGeoffrey  Wile M.D.   On: 10/03/2019 16:54   Result Date: 10/03/2019 CLINICAL DATA:  Concern for acute abdominal process in the setting of pregnancy. EXAM: MRI ABDOMEN WITHOUT CONTRAST TECHNIQUE: Multiplanar multisequence MR imaging was performed without the administration of intravenous contrast. COMPARISON:  Ob ultrasound same date FINDINGS: Lower chest: Incidental imaging of the lung bases is unremarkable. Hepatobiliary: Normal appearance of the liver and gallbladder. Pancreas: No mass, inflammatory changes, or other parenchymal abnormality identified. Spleen:  Normal spleen Adrenals/Urinary Tract: Normal adrenal glands. Smooth contour bilateral kidneys no signs of hydronephrosis. Stomach/Bowel: Limited assessment of bowel. No gross signs of acute bowel process. There is diffuse fluid that surrounds bowel loops showing complexity particularly in the pelvis compatible with hemoperitoneum based on intermediate to high T1 signal and heterogeneous T2 signal, see below for further detail. Appendix is not visualized. Vascular/Lymphatic: Abdominal vasculature, grossly patent, not well evaluated without contrast. Reproductive: Intrauterine pregnancy as seen on the recent ultrasound. In the right adnexa just adjacent to the uterus is a 3.6 x 3.3 cm area that shows signs of decidual reaction and  internal complexity. These are findings of heterotopic pregnancy adjacent to right ovarian cyst. The left ovary is normal. Musculoskeletal: No sign of acute or destructive bone process. IMPRESSION: Signs of ruptured heterotopic pregnancy in the right adnexa with large volume hemoperitoneum. Also with signs of intrauterine pregnancy as shown on recent sonogram. Limited assessment of bowel to include the appendix due to large volume hemoperitoneum. Critical Value/emergent results were called by telephone at the time of interpretation on 10/03/2019 at 4:25 pm to [facility] , who verbally acknowledged these results. Electronically Signed: By: Donzetta KohutGeoffrey  Wile M.D. On: 10/03/2019 16:44   US OB Comp Less 14 Wks  Result Date: 10/03/2019 CLINICAL DATA:  Pelvic pain EXAM: OBSTETRIC <14 WK ULTRASOUND TECHNIQUE: Transabdominal ultrasound was performed for evaluation of the gestation as well as the maternal uterus and adnexal regions. COMPARISON:  None. FINDINGS: Intrauterine gestational sac: Single Yolk sac:  Visualized. Embryo:  Visualized. Cardiac Activity: Visualized. Heart Rate: 162 bpm CRL:   30.4 mm   9 w 6 d                  US EDC: 05/02/2019 Subchorionic hemorrhage:  None visualized. Maternal uterus/adnexae: Within the right ovary there is a anechoic cyst measuring 4.0 x 3.7 x 4.0 cm. The left ovary is normal in appearance. IMPRESSION: Single live intrauterine pregnancy measuring 9 weeks 6 days. Electronically Signed   By: Jonna ClarkBindu  Avutu M.D.   On: 10/03/2019 14:11    Assessment: 28 y.o. G1P0  Plan:   The patient does not have identifiable risk factors which would increase her risk of ectopic pregnancy (Approximately 50% of all patient presenting with ectopic  pregnancy will have identifiable risk factors such as prior GC/CT, PID, or ART).  The discriminatory zone for visualizing a singleton intrauterine pregnancy is an HCG level of approximately 154mIU/mL to 2535mIU/mL, although the recommended  value to rule out an intrauterine pregnancy has been set conservatively high at 3560mIU/mL to account for twin gestations which occur at a rate of approximately 2% of all spontaneous conceptions.  Serial HCG measurements are more helpful in determining the patients risk of ectopic than a single value.  Demonstration of adnexal mass with a hypoechoic center on ultrasound has a positive predictive value of approximately 80% and may misidentify corpus luteum cysts, paratubal cysts, and or hydrosalpinx among others.  An intrauterine gestational sac makes ectopic pregnancy less likely but may represent pseudosac.  The diagnosis of intrauterine pregnancy requires documentation of an intrauterine gestational sac with accompanying yolk sac or fetal pole.  Heterotopic pregnancy is exceedingly rare but may still be present in cases of confirmed intrauterine pregnancy with a reported incidence of 1 in 4,000 to 1 in 30,000.   Ruptured ectopic pregnancy continued to account for 2.7% of all maternal deaths, and the incidence of ectopic pregnancy is reported at approximately 2% of all spontaneous conceptions.  The patient understand the importance of returning for repeat HCG measurement in 48-hrs, as well as follow up ultrasound.  She is currently hemodynamically stable, with a non-acute pelvic and abdominal exam, and reliable.  This is a desired pregnancy.  She was given strict precautions should she develop acute abdominal pain and or orthostatic symptoms.  ACOG Practice Bulletin 191 February 2018 "Ectopic Pregnancy"  - based on MRI and hemoperitoneum concern for heterotopic - set up for 2 units - diagnostic laparoscopy, possible laparotomy, possible hysterectomy if cornual and unable to control hemorrhage - notified Dr. Glennon Mac to be on stand by - Will proceed emergently to OR instead of waiting on covid test which was collected in ED  Malachy Mood, MD, [place], [place] 10/03/2019, 5:06 PM

## 2019-10-03 NOTE — ED Notes (Signed)
Called lab to confirm to run type and screen as pt already has blood band on wrist. Levada Dy in lab states they will run it now.

## 2019-10-03 NOTE — ED Notes (Signed)
Pt states starting to feel much better from morphine.

## 2019-10-03 NOTE — Anesthesia Post-op Follow-up Note (Signed)
Anesthesia QCDR form completed.        

## 2019-10-03 NOTE — ED Triage Notes (Signed)
Jeanne Pina, RN attempted to take patient back for triage. Pt refusing to go back to triage. Pt's SO in lobby at this time, noted to be angry that patient is in the lobby at this time. Pt remains in recliner.

## 2019-10-03 NOTE — ED Provider Notes (Signed)
Contacted by radiologist for MRI findings which were indicative of a ruptured heterotopic pregnancy.  Have discussed with Dr. Georgianne Fick from OB/GYN who will come evaluate the patient in the ER.   Earleen Newport, MD 10/03/19 [phone]

## 2019-10-03 NOTE — Anesthesia Postprocedure Evaluation (Signed)
Anesthesia Post Note  Patient: Jeanne Tyler  Procedure(s) Performed: LAPAROSCOPY DIAGNOSTIC (N/A Abdomen) LAPAROSCOPIC UNILATERAL SALPINGECTOMY (Right Abdomen)  Patient location during evaluation: PACU Anesthesia Type: General Level of consciousness: awake and alert Pain management: pain level controlled Vital Signs Assessment: post-procedure vital signs reviewed and stable Respiratory status: spontaneous breathing and respiratory function stable Cardiovascular status: stable Anesthetic complications: no     Last Vitals:  Vitals:   10/03/19 1630 10/03/19 1941  BP: 108/66 120/90  Pulse: 71 (!) 102  Resp:  11  Temp:  (!) 35.7 C  SpO2: 100% 100%    Last Pain:  Vitals:   10/03/19 1941  TempSrc:   PainSc: 0-No pain                 [name] K

## 2019-10-03 NOTE — ED Provider Notes (Signed)
[facility] Emergency Department Provider Note   ____________________________________________    I have reviewed the triage vital signs and the nursing notes.   HISTORY  Chief Complaint Abdominal pain    HPI Jeanne Tyler is a 28 y.o. female presents with complaints of severe lower abdominal pain which began last night has been constant.  She notes that she is [redacted] weeks pregnant, this is her second pregnancy.  No difficulty with first pregnancy.  She is followed at [facility] in the high risk clinic, she is reportedly Rh-.  No fevers or chills.  Positive nausea.  No vaginal bleeding.  Has had ultrasound demonstrating IUP.  Has not take anything for this except for Zofran given by EMS  History reviewed. No pertinent past medical history.  There are no problems to display for this patient.   History reviewed. No pertinent surgical history.  Prior to Admission medications   Medication Sig Start Date End Date Taking? Authorizing Provider  cyclobenzaprine (FLEXERIL) 10 MG tablet Take 1 tablet (10 mg total) by mouth 3 (three) times daily as needed. 01/01/19   Darci Current, MD  ibuprofen (ADVIL,MOTRIN) 800 MG tablet Take 1 tablet (800 mg total) by mouth every 8 (eight) hours as needed. 01/01/19   Darci Current, MD  promethazine (PHENERGAN) 12.5 MG suppository Place 1 suppository (12.5 mg total) rectally every 6 (six) hours as needed for nausea or vomiting. 09/06/19   Jene Every, MD     Allergies Tramadol  History reviewed. No pertinent family history.  Social History Social History   Tobacco Use  . Smoking status: Never Smoker  . Smokeless tobacco: Never Used  Substance Use Topics  . Alcohol use: Not Currently  . Drug use: Never    Review of Systems  Constitutional: No fever/chills Eyes: No visual changes.  ENT: No sore throat. Cardiovascular: Denies chest pain. Respiratory: Denies shortness of breath. Gastrointestinal: As above  Genitourinary: Negative for dysuria. Musculoskeletal: Negative for back pain. Skin: Negative for rash. Neurological: Negative for headaches   ____________________________________________   PHYSICAL EXAM:  VITAL SIGNS: ED Triage Vitals  Enc Vitals Group     BP 10/03/19 1140 (!) 90/52     Pulse Rate 10/03/19 1140 87     Resp 10/03/19 1140 16     Temp 10/03/19 1140 98 F (36.7 C)     Temp Source 10/03/19 1140 Oral     SpO2 10/03/19 1140 100 %     Weight 10/03/19 1141 61.2 kg (135 lb)     Height 10/03/19 1141 1.524 m (5')     Head Circumference --      Peak Flow --      Pain Score 10/03/19 1141 10     Pain Loc --      Pain Edu? --      Excl. in GC? --     Constitutional: Alert and oriented.   Nose: No congestion/rhinnorhea. Mouth/Throat: Mucous membranes are moist.    Cardiovascular: Normal rate, regular rhythm. Grossly normal heart sounds.  Good peripheral circulation. Respiratory: Normal respiratory effort.  No retractions. Lungs CTAB. Gastrointestinal: Tenderness palpation suprapubically, no distention, no CVA tenderness  Musculoskeletal:  Warm and well perfused Neurologic:  Normal speech and language. No gross focal neurologic deficits are appreciated.  Skin:  Skin is warm, dry and intact. No rash noted. Psychiatric: Mood and affect are normal. Speech and behavior are normal.  ____________________________________________   LABS (all labs ordered are listed, but only abnormal  results are displayed)  Labs Reviewed  CBC WITH DIFFERENTIAL/PLATELET - Abnormal; Notable for the following components:      Result Value   WBC 18.0 (*)    RBC 3.77 (*)    Hemoglobin 11.3 (*)    HCT 32.0 (*)    Neutro Abs 14.3 (*)    Abs Immature Granulocytes 0.10 (*)    All other components within normal limits  COMPREHENSIVE METABOLIC PANEL - Abnormal; Notable for the following components:   Glucose, Bld 129 (*)    Calcium 8.7 (*)    Albumin 3.4 (*)    All other components within  normal limits  HCG, QUANTITATIVE, PREGNANCY  URINALYSIS, COMPLETE (UACMP) WITH MICROSCOPIC  SAMPLE TO BLOOD BANK   ____________________________________________  EKG  None ____________________________________________  RADIOLOGY  Ultrasound ____________________________________________   PROCEDURES  Procedure(s) performed: No  Procedures   Critical Care performed: No ____________________________________________   INITIAL IMPRESSION / ASSESSMENT AND PLAN / ED COURSE  Pertinent labs & imaging results that were available during my care of the patient were reviewed by me and considered in my medical decision making (see chart for details).  Patient presents with significant lower abdominal pain, has had an ultrasound demonstrating IUP so not consistent with ectopic.  Possible miscarriage, no bleeding at this time.  Will give IV morphine given her pain, obtain ultrasound and closely monitor  ----------------------------------------- 12:38 PM on 10/03/2019 -----------------------------------------  Patient is feeling better after IV morphine, noted white blood cell count is elevated, pending ultrasound  Have asked Dr. Jimmye Norman to follow up on [place]    ____________________________________________   FINAL CLINICAL IMPRESSION(S) / ED DIAGNOSES  Final diagnoses:  Abdominal pain during pregnancy in first trimester        Note:  This document was prepared using Dragon voice recognition software and may include unintentional dictation errors.   Lavonia Drafts, MD 10/03/19 1239

## 2019-10-03 NOTE — ED Notes (Signed)
I have given morphine a few minutes ago.  Patient was able to turn a bit on the stretcher, but continues to moan with pain.

## 2019-10-03 NOTE — ED Triage Notes (Signed)
Patient arrived by EMS for abdominal pain with nausea. [redacted] weeks pregnant. Patient reports having first OBGYN appointment with [place] that was unremarkable. Patient reports being seen at high risk clinic in [place].

## 2019-10-03 NOTE — ED Notes (Signed)
Report given to OR.

## 2019-10-03 NOTE — ED Provider Notes (Signed)
Went into evaluate patient, continues to complain about pain.  States has difficulty breathing when she is laying down due to the pain.   Patient's blood pressure is a little low so we only gave her morphine 2 mg IV so as not to drop the blood pressure further. Ultrasound showed a IUP. Due to the amount of pain that is not consistent with the ultrasound results, we ordered a MRI of the abdomen/pelvis.  See Dr. Jimmye Norman note about the MRI.   Versie Starks, PA-C 10/03/19 1659    Earleen Newport, MD 10/03/19 [phone]

## 2019-10-04 LAB — RHOGAM INJECTION: Unit division: 0

## 2019-10-04 LAB — TYPE AND SCREEN
ABO/RH(D): B NEG
Antibody Screen: NEGATIVE
Unit division: 0
Unit division: 0

## 2019-10-04 LAB — BPAM RBC
Blood Product Expiration Date: 202012202359
Blood Product Expiration Date: 202101062359
Unit Type and Rh: 1700
Unit Type and Rh: 1700

## 2019-10-05 LAB — PREPARE RBC (CROSSMATCH)

## 2019-10-07 LAB — SURGICAL PATHOLOGY

## 2019-10-09 ENCOUNTER — Other Ambulatory Visit: Payer: Self-pay

## 2019-10-09 DIAGNOSIS — Z3A10 10 weeks gestation of pregnancy: Secondary | ICD-10-CM | POA: Diagnosis not present

## 2019-10-09 DIAGNOSIS — R102 Pelvic and perineal pain: Secondary | ICD-10-CM | POA: Diagnosis not present

## 2019-10-09 DIAGNOSIS — Z79899 Other long term (current) drug therapy: Secondary | ICD-10-CM | POA: Diagnosis not present

## 2019-10-09 DIAGNOSIS — O210 Mild hyperemesis gravidarum: Secondary | ICD-10-CM | POA: Insufficient documentation

## 2019-10-09 DIAGNOSIS — R112 Nausea with vomiting, unspecified: Secondary | ICD-10-CM | POA: Diagnosis present

## 2019-10-09 NOTE — ED Triage Notes (Signed)
Pt had ectopic pregnancy and had fetus removed from tube 3 days ago. Pt still has viable fetus in uterus at 11 weeks. Pt to ED tonight due to N/V that started today. Pt seen at [facility] on Tuesday and is to see surgeon for follow up today. Pt denies vaginal bleeding/discharge. Pt has not had BM since surgery. She reports that she has too much acid when she takes the stool softener.

## 2019-10-10 ENCOUNTER — Emergency Department: Payer: Medicaid Other

## 2019-10-10 ENCOUNTER — Emergency Department
Admission: EM | Admit: 2019-10-10 | Discharge: 2019-10-10 | Disposition: A | Discharge status: Home / Self Care | Payer: Medicaid Other | Attending: Emergency Medicine | Admitting: Emergency Medicine

## 2019-10-10 ENCOUNTER — Encounter: Payer: Self-pay | Admitting: Emergency Medicine

## 2019-10-10 DIAGNOSIS — R102 Pelvic and perineal pain: Secondary | ICD-10-CM

## 2019-10-10 DIAGNOSIS — O210 Mild hyperemesis gravidarum: Secondary | ICD-10-CM

## 2019-10-10 LAB — CBC WITH DIFFERENTIAL/PLATELET
Abs Immature Granulocytes: 0.06 10*3/uL (ref 0.00–0.07)
Basophils Absolute: 0 10*3/uL (ref 0.0–0.1)
Basophils Relative: 0 %
Eosinophils Absolute: 0 10*3/uL (ref 0.0–0.5)
Eosinophils Relative: 0 %
HCT: 32.1 % — ABNORMAL LOW (ref 36.0–46.0)
Hemoglobin: 11 g/dL — ABNORMAL LOW (ref 12.0–15.0)
Immature Granulocytes: 1 %
Lymphocytes Relative: 19 %
Lymphs Abs: 2.2 10*3/uL (ref 0.7–4.0)
MCH: 30.2 pg (ref 26.0–34.0)
MCHC: 34.3 g/dL (ref 30.0–36.0)
MCV: 88.2 fL (ref 80.0–100.0)
Monocytes Absolute: 0.6 10*3/uL (ref 0.1–1.0)
Monocytes Relative: 5 %
Neutro Abs: 8.8 10*3/uL — ABNORMAL HIGH (ref 1.7–7.7)
Neutrophils Relative %: 75 %
Platelets: 399 10*3/uL (ref 150–400)
RBC: 3.64 MIL/uL — ABNORMAL LOW (ref 3.87–5.11)
RDW: 12.7 % (ref 11.5–15.5)
WBC: 11.8 10*3/uL — ABNORMAL HIGH (ref 4.0–10.5)
nRBC: 0 % (ref 0.0–0.2)

## 2019-10-10 LAB — COMPREHENSIVE METABOLIC PANEL
ALT: 13 U/L (ref 0–44)
AST: 18 U/L (ref 15–41)
Albumin: 3.8 g/dL (ref 3.5–5.0)
Alkaline Phosphatase: 51 U/L (ref 38–126)
Anion gap: 11 (ref 5–15)
BUN: 12 mg/dL (ref 6–20)
CO2: 19 mmol/L — ABNORMAL LOW (ref 22–32)
Calcium: 9 mg/dL (ref 8.9–10.3)
Chloride: 104 mmol/L (ref 98–111)
Creatinine, Ser: 0.5 mg/dL (ref 0.44–1.00)
GFR calc Af Amer: >60 mL/min (ref >60)
GFR calc non Af Amer: >60 mL/min (ref >60)
Glucose, Bld: 82 mg/dL (ref 70–99)
Potassium: 3.4 mmol/L — ABNORMAL LOW (ref 3.5–5.1)
Sodium: 134 mmol/L — ABNORMAL LOW (ref 135–145)
Total Bilirubin: 1 mg/dL (ref 0.3–1.2)
Total Protein: 7.8 g/dL (ref 6.5–8.1)

## 2019-10-10 LAB — URINALYSIS, COMPLETE (UACMP) WITH MICROSCOPIC
Bacteria, UA: NONE SEEN
Bilirubin Urine: NEGATIVE
Glucose, UA: NEGATIVE mg/dL
Hgb urine dipstick: NEGATIVE
Ketones, ur: 80 mg/dL — AB
Leukocytes,Ua: NEGATIVE
Nitrite: NEGATIVE
Protein, ur: 30 mg/dL — AB
Specific Gravity, Urine: 1.019 (ref 1.005–1.030)
Squamous Epithelial / HPF: NONE SEEN (ref 0–5)
pH: 6 (ref 5.0–8.0)

## 2019-10-10 LAB — LIPASE, BLOOD: Lipase: 27 U/L (ref 11–51)

## 2019-10-10 MED ORDER — ONDANSETRON HCL 4 MG/2ML IJ SOLN
4.0000 mg | Freq: Once | INTRAMUSCULAR | Status: AC
  Administered 2019-10-10: 4 mg via INTRAVENOUS
  Filled 2019-10-10: qty 2

## 2019-10-10 MED ORDER — DEXTROSE-NACL 5-0.45 % IV SOLN: INTRAVENOUS | Status: DC

## 2019-10-10 MED ORDER — SODIUM CHLORIDE 0.9 % IV BOLUS
1000.0000 mL | Freq: Once | INTRAVENOUS | Status: AC
  Administered 2019-10-10: 1000 mL via INTRAVENOUS

## 2019-10-10 MED ORDER — SENNOSIDES-DOCUSATE SODIUM 8.6-50 MG PO TABS
2.0000 | ORAL_TABLET | Freq: Once | ORAL | Status: AC
  Administered 2019-10-10: 2 via ORAL
  Filled 2019-10-10: qty 2

## 2019-10-10 MED ORDER — SODIUM CHLORIDE 0.9 % IV BOLUS: 1000.0000 mL | Freq: Once | INTRAVENOUS | Status: DC

## 2019-10-10 MED ORDER — METOCLOPRAMIDE HCL 10 MG PO TABS: 10.0000 mg | ORAL_TABLET | Freq: Three times a day (TID) | ORAL | 0 refills | Status: DC

## 2019-10-10 NOTE — ED Notes (Addendum)
Patient is resting comfortably. Pt did not try to eat.

## 2019-10-10 NOTE — ED Notes (Signed)
Pt back from US

## 2019-10-10 NOTE — ED Notes (Signed)
Pt reports nausea again upon waking. Pt did not attempt to eat any food. Pt took a few sips of ginger ale. Pt states the IV in her left hand is painful as fluid flows. No fluid is flowing at this time. Offered to stick pt for a new iv or attempt to flush the current one. Pt declines a new IV. MD notified.

## 2019-10-10 NOTE — ED Notes (Signed)
Pt given water and crackers  

## 2019-10-10 NOTE — ED Notes (Signed)
ED Provider at bedside. 
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
## 2019-10-10 NOTE — ED Notes (Signed)
Pt given crackers and ginger ale to tolerate

## 2019-12-10 ENCOUNTER — Encounter: Payer: Self-pay | Admitting: *Deleted

## 2020-01-01 ENCOUNTER — Observation Stay
Admission: EM | Admit: 2020-01-01 | Discharge: 2020-01-02 | Disposition: A | Discharge status: Home / Self Care | Payer: Medicaid Other | Attending: Certified Nurse Midwife | Admitting: Certified Nurse Midwife

## 2020-01-01 ENCOUNTER — Other Ambulatory Visit: Payer: Self-pay

## 2020-01-01 DIAGNOSIS — Z041 Encounter for examination and observation following transport accident: Principal | ICD-10-CM | POA: Insufficient documentation

## 2020-01-01 DIAGNOSIS — Z79899 Other long term (current) drug therapy: Secondary | ICD-10-CM | POA: Insufficient documentation

## 2020-01-01 DIAGNOSIS — R1030 Lower abdominal pain, unspecified: Secondary | ICD-10-CM | POA: Diagnosis not present

## 2020-01-01 DIAGNOSIS — Z9079 Acquired absence of other genital organ(s): Secondary | ICD-10-CM | POA: Diagnosis not present

## 2020-01-01 DIAGNOSIS — M545 Low back pain: Secondary | ICD-10-CM | POA: Diagnosis not present

## 2020-01-01 DIAGNOSIS — O26892 Other specified pregnancy related conditions, second trimester: Secondary | ICD-10-CM | POA: Insufficient documentation

## 2020-01-01 DIAGNOSIS — M79659 Pain in unspecified thigh: Secondary | ICD-10-CM | POA: Diagnosis not present

## 2020-01-01 DIAGNOSIS — Z3A23 23 weeks gestation of pregnancy: Secondary | ICD-10-CM | POA: Diagnosis not present

## 2020-01-01 DIAGNOSIS — Z885 Allergy status to narcotic agent status: Secondary | ICD-10-CM | POA: Insufficient documentation

## 2020-01-01 DIAGNOSIS — O9A212 Injury, poisoning and certain other consequences of external causes complicating pregnancy, second trimester: Secondary | ICD-10-CM | POA: Diagnosis not present

## 2020-01-01 DIAGNOSIS — Z8759 Personal history of other complications of pregnancy, childbirth and the puerperium: Secondary | ICD-10-CM | POA: Insufficient documentation

## 2020-01-01 DIAGNOSIS — S61412A Laceration without foreign body of left hand, initial encounter: Secondary | ICD-10-CM | POA: Diagnosis not present

## 2020-01-01 LAB — CBC
HCT: 32.4 % — ABNORMAL LOW (ref 36.0–46.0)
Hemoglobin: 10.7 g/dL — ABNORMAL LOW (ref 12.0–15.0)
MCH: 30.1 pg (ref 26.0–34.0)
MCHC: 33 g/dL (ref 30.0–36.0)
MCV: 91.3 fL (ref 80.0–100.0)
Platelets: 320 10*3/uL (ref 150–400)
RBC: 3.55 MIL/uL — ABNORMAL LOW (ref 3.87–5.11)
RDW: 12.4 % (ref 11.5–15.5)
WBC: 10.2 10*3/uL (ref 4.0–10.5)
nRBC: 0 % (ref 0.0–0.2)

## 2020-01-01 LAB — COMPREHENSIVE METABOLIC PANEL
ALT: 11 U/L (ref 0–44)
AST: 17 U/L (ref 15–41)
Albumin: 3.2 g/dL — ABNORMAL LOW (ref 3.5–5.0)
Alkaline Phosphatase: 65 U/L (ref 38–126)
Anion gap: 5 (ref 5–15)
BUN: 7 mg/dL (ref 6–20)
CO2: 24 mmol/L (ref 22–32)
Calcium: 8.7 mg/dL — ABNORMAL LOW (ref 8.9–10.3)
Chloride: 105 mmol/L (ref 98–111)
Creatinine, Ser: 0.63 mg/dL (ref 0.44–1.00)
GFR calc Af Amer: >60 mL/min (ref >60)
GFR calc non Af Amer: >60 mL/min (ref >60)
Glucose, Bld: 90 mg/dL (ref 70–99)
Potassium: 4.1 mmol/L (ref 3.5–5.1)
Sodium: 134 mmol/L — ABNORMAL LOW (ref 135–145)
Total Bilirubin: 0.6 mg/dL (ref 0.3–1.2)
Total Protein: 6.7 g/dL (ref 6.5–8.1)

## 2020-01-01 MED ORDER — ACETAMINOPHEN 500 MG PO TABS
1000.0000 mg | ORAL_TABLET | Freq: Four times a day (QID) | ORAL | Status: DC | PRN
  Administered 2020-01-01: 1000 mg via ORAL
  Filled 2020-01-01: qty 2

## 2020-01-01 MED ORDER — RHO D IMMUNE GLOBULIN 1500 UNIT/2ML IJ SOSY
300.0000 ug | PREFILLED_SYRINGE | Freq: Once | INTRAMUSCULAR | Status: DC
  Filled 2020-01-01: qty 2
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
## 2020-01-02 DIAGNOSIS — Z041 Encounter for examination and observation following transport accident: Secondary | ICD-10-CM | POA: Diagnosis not present

## 2020-01-02 LAB — TYPE AND SCREEN
ABO/RH(D): B NEG
Antibody Screen: POSITIVE
Unit division: 0
Unit division: 0

## 2020-01-02 LAB — BPAM RBC
Blood Product Expiration Date: 202103302359
Blood Product Expiration Date: 202103302359
Unit Type and Rh: 1700
Unit Type and Rh: 1700

## 2020-01-02 LAB — KLEIHAUER-BETKE STAIN
# Vials RhIg: 1
Fetal Cells %: 0 %
Quantitation Fetal Hemoglobin: 0 mL

## 2020-01-02 MED ORDER — ACETAMINOPHEN 500 MG PO TABS: 1000.0000 mg | ORAL_TABLET | Freq: Four times a day (QID) | ORAL | 0 refills | Status: AC | PRN
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
## 2020-01-03 LAB — RHOGAM INJECTION: Unit division: 0

## 2020-01-15 ENCOUNTER — Other Ambulatory Visit: Payer: Self-pay

## 2020-01-15 ENCOUNTER — Encounter: Payer: Self-pay | Admitting: Obstetrics and Gynecology

## 2020-01-15 ENCOUNTER — Observation Stay
Admission: EM | Admit: 2020-01-15 | Discharge: 2020-01-15 | Disposition: A | Discharge status: Home / Self Care | Payer: Medicaid Other | Attending: Certified Nurse Midwife | Admitting: Certified Nurse Midwife

## 2020-01-15 DIAGNOSIS — Z3A25 25 weeks gestation of pregnancy: Secondary | ICD-10-CM | POA: Diagnosis not present

## 2020-01-15 DIAGNOSIS — O368120 Decreased fetal movements, second trimester, not applicable or unspecified: Secondary | ICD-10-CM | POA: Diagnosis not present

## 2020-01-15 NOTE — Discharge Summary (Signed)
RN reviewed discharge instructions with patient. Gave patient opportunity for questions. All questions answered at this time. Pt verbalized understanding. Pt. Discharged home with her mother.

## 2020-01-15 NOTE — Progress Notes (Signed)
Provider and RN at bedside performing bedside ultrasound.
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
## 2020-01-15 NOTE — OB Triage Note (Signed)
Pt c/o decreased fetal movement today. Pt states she has ate and drank today. Pt also reports vomiting twice today and describes it as yellow stomach bile. Denies pain, bleeding, or LOF. Vitals WNL. Will continue to monitor.

## 2020-03-15 ENCOUNTER — Other Ambulatory Visit: Payer: Self-pay

## 2020-03-15 ENCOUNTER — Observation Stay
Admission: EM | Admit: 2020-03-15 | Discharge: 2020-03-16 | Disposition: A | Discharge status: Home / Self Care | Payer: Medicaid Other | Attending: Obstetrics and Gynecology | Admitting: Obstetrics and Gynecology

## 2020-03-15 ENCOUNTER — Encounter: Payer: Self-pay | Admitting: Obstetrics and Gynecology

## 2020-03-15 DIAGNOSIS — N898 Other specified noninflammatory disorders of vagina: Secondary | ICD-10-CM

## 2020-03-15 DIAGNOSIS — Z3A33 33 weeks gestation of pregnancy: Secondary | ICD-10-CM | POA: Insufficient documentation

## 2020-03-15 DIAGNOSIS — R109 Unspecified abdominal pain: Secondary | ICD-10-CM

## 2020-03-15 DIAGNOSIS — Z3490 Encounter for supervision of normal pregnancy, unspecified, unspecified trimester: Secondary | ICD-10-CM

## 2020-03-15 LAB — WET PREP, GENITAL
Clue Cells Wet Prep HPF POC: NONE SEEN
Sperm: NONE SEEN
Trich, Wet Prep: NONE SEEN
Yeast Wet Prep HPF POC: NONE SEEN

## 2020-03-15 MED ORDER — ACETAMINOPHEN 500 MG PO TABS: 1000.0000 mg | ORAL_TABLET | Freq: Once | ORAL | Status: DC

## 2020-03-15 MED ORDER — ACETAMINOPHEN 500 MG PO TABS
ORAL_TABLET | ORAL | Status: AC
  Filled 2020-03-15: qty 2

## 2020-03-15 NOTE — OB Triage Note (Signed)
Pt G4P1 [id number] presents to [facility]place from the ED complains of constant cramping in back and abdomen and white sticky discharge, denies LOF, vaginal bleeding. Reports good fetal movement. VSS. Monitors on and assessing.

## 2020-03-16 DIAGNOSIS — R109 Unspecified abdominal pain: Secondary | ICD-10-CM

## 2020-03-16 DIAGNOSIS — N898 Other specified noninflammatory disorders of vagina: Secondary | ICD-10-CM

## 2020-03-16 DIAGNOSIS — Z3A33 33 weeks gestation of pregnancy: Secondary | ICD-10-CM

## 2020-03-16 DIAGNOSIS — O26893 Other specified pregnancy related conditions, third trimester: Secondary | ICD-10-CM

## 2020-03-16 LAB — URINALYSIS, ROUTINE W REFLEX MICROSCOPIC
Bilirubin Urine: NEGATIVE
Glucose, UA: NEGATIVE mg/dL
Ketones, ur: NEGATIVE mg/dL
Leukocytes,Ua: NEGATIVE
Nitrite: NEGATIVE
Protein, ur: NEGATIVE mg/dL
Specific Gravity, Urine: 1.003 — ABNORMAL LOW (ref 1.005–1.030)
pH: 6 (ref 5.0–8.0)

## 2020-03-16 NOTE — Progress Notes (Signed)
Pt verbalized desire to go home. After several attempts to reach Dr. Valentino Saxon, called midwife Marcelino Duster and got verbal order for discharge.

## 2020-03-19 DIAGNOSIS — N898 Other specified noninflammatory disorders of vagina: Secondary | ICD-10-CM

## 2020-03-19 DIAGNOSIS — O26893 Other specified pregnancy related conditions, third trimester: Secondary | ICD-10-CM

## 2020-03-19 NOTE — Final Progress Note (Signed)
L&D OB Triage Note  Jeanne Tyler is a 29 y.o. G4P1021  unassigned female at [redacted]w[redacted]d, EDD Estimated Date of Delivery: 04/29/20 who presented to triage for complaints of cramping in back and abdomen. She currently receives care at [facility].  Also noting complaints of a white "sticky" discharge. She was evaluated by the nurses. Vital signs stable. An NST was performed and has been reviewed by MD.   NST INTERPRETATION: Indications: rule out uterine contractions  Mode: External Baseline Rate (A): 135 bpm Variability: Moderate Accelerations: 15 x 15 Decelerations: None     Contraction Frequency (min): none  Impression: reactive    Labs:  Results for orders placed or performed during the hospital encounter of 03/15/20  Wet prep, genital  Result Value Ref Range   Yeast Wet Prep HPF POC NONE SEEN NONE SEEN   Trich, Wet Prep NONE SEEN NONE SEEN   Clue Cells Wet Prep HPF POC NONE SEEN NONE SEEN   WBC, Wet Prep HPF POC FEW (A) NONE SEEN   Sperm NONE SEEN   Urinalysis, Routine w reflex microscopic  Result Value Ref Range   Color, Urine STRAW (A) YELLOW   APPearance CLEAR (A) CLEAR   Specific Gravity, Urine 1.003 (L) 1.005 - 1.030   pH 6.0 5.0 - 8.0   Glucose, UA NEGATIVE NEGATIVE mg/dL   Hgb urine dipstick SMALL (A) NEGATIVE   Bilirubin Urine NEGATIVE NEGATIVE   Ketones, ur NEGATIVE NEGATIVE mg/dL   Protein, ur NEGATIVE NEGATIVE mg/dL   Nitrite NEGATIVE NEGATIVE   Leukocytes,Ua NEGATIVE NEGATIVE   RBC / HPF 0-5 0 - 5 RBC/hpf   WBC, UA 0-5 0 - 5 WBC/hpf   Bacteria, UA RARE (A) NONE SEEN   Squamous Epithelial / LPF 0-5 0 - 5    Plan: NST performed was reviewed and was found to be reactive. She was treated with PO Tylenol.  She was discharged home with preterm labor precautions.  Continue routine prenatal care. Follow up with OB/GYN as previously scheduled.     Hildred Laser, MD  [facility]

## 2020-04-13 ENCOUNTER — Observation Stay
Admission: EM | Admit: 2020-04-13 | Discharge: 2020-04-13 | Disposition: A | Discharge status: Home / Self Care | Payer: Medicaid Other | Attending: Obstetrics and Gynecology | Admitting: Obstetrics and Gynecology

## 2020-04-13 DIAGNOSIS — Z79899 Other long term (current) drug therapy: Secondary | ICD-10-CM | POA: Insufficient documentation

## 2020-04-13 DIAGNOSIS — O26893 Other specified pregnancy related conditions, third trimester: Secondary | ICD-10-CM | POA: Diagnosis present

## 2020-04-13 DIAGNOSIS — R102 Pelvic and perineal pain: Secondary | ICD-10-CM | POA: Diagnosis present

## 2020-04-13 DIAGNOSIS — Z3A37 37 weeks gestation of pregnancy: Secondary | ICD-10-CM | POA: Insufficient documentation

## 2020-04-13 DIAGNOSIS — O34219 Maternal care for unspecified type scar from previous cesarean delivery: Secondary | ICD-10-CM | POA: Insufficient documentation

## 2020-04-13 MED ORDER — OXYCODONE HCL 5 MG PO TABS: 5.0000 mg | ORAL_TABLET | Freq: Four times a day (QID) | ORAL | 0 refills | Status: AC | PRN

## 2020-04-13 NOTE — Discharge Summary (Signed)
Physician Final Progress Note  Patient ID: Jeanne Tyler MRN: [id number] DOB/AGE: June 27, 1991 29 y.o.  Admit date: 04/13/2020 Admitting provider: Tresea Mall, CNM Discharge date: 04/13/2020   Admission Diagnoses: pelvic pain  Discharge Diagnoses:  Active Problems:   Pelvic pain affecting pregnancy in third trimester, antepartum IUP at 37 weeks Reactive NST Symphysis Pubis Dysfunction  History of Present Illness: The patient is a 29 y.o. female [phone] at [redacted]w[redacted]d who presents for pelvic pain that began last night. She describes the pain as sharp and stabbing and she is unable to shift her pelvis easily or lift her legs. The pain is located primarily over the mid lower pelvis and goes into her upper thighs. The pain has been constant since last night. She reports good fetal movement. She denies vaginal bleeding, leaking of fluid or contractions. She denies burning with urination, vaginal irritation, itching, discharge or odor. She thought about getting in the tub and was unable to lift her legs well enough to do that.   We discussed symphysis pubis dysfunction, comfort measures, pain relief, and follow up with her care provider.   No past medical history on file.  Past Surgical History:  Procedure Laterality Date  . CESAREAN SECTION  2012   fetal distress  . LAPAROSCOPIC UNILATERAL SALPINGECTOMY Right 10/03/2019   Procedure: LAPAROSCOPIC UNILATERAL SALPINGECTOMY;  Surgeon: Vena Austria, MD;  Location: [facility] ORS;  Service: Gynecology;  Laterality: Right;  . LAPAROSCOPIC UNILATERAL SALPINGECTOMY Left 2018   ectopic pregnancy  . LAPAROSCOPY N/A 10/03/2019   Procedure: LAPAROSCOPY DIAGNOSTIC;  Surgeon: Vena Austria, MD;  Location: [facility] ORS;  Service: Gynecology;  Laterality: N/A;    No current facility-administered medications on file prior to encounter.   Current Outpatient Medications on File Prior to Encounter  Medication Sig Dispense Refill  . acetaminophen (TYLENOL) 500  MG tablet Take 2 tablets (1,000 mg total) by mouth every 6 (six) hours as needed for fever or headache. 30 tablet 0  . ondansetron (ZOFRAN) 8 MG tablet Take 8 mg by mouth every 8 (eight) hours as needed for nausea or vomiting.    . Prenatal MV-Min-FA-Omega-3 (PRENATAL GUMMIES/DHA & FA) 0.4-32.5 MG CHEW Chew 2 Pieces by mouth daily.    . promethazine (PHENERGAN) 25 MG tablet Take 25 mg by mouth every 6 (six) hours as needed for nausea or vomiting.    Marland Kitchen scopolamine (TRANSDERM-SCOP) 1 MG/3DAYS Place 1 patch onto the skin every 3 (three) days.      Allergies  Allergen Reactions  . Tramadol Swelling    Social History   Socioeconomic History  . Marital status: Significant Other    Spouse name: Elijah  . Number of children: Not on file  . Years of education: Not on file  . Highest education level: Not on file  Occupational History  . Not on file  Tobacco Use  . Smoking status: Never Smoker  . Smokeless tobacco: Never Used  Vaping Use  . Vaping Use: Never used  Substance and Sexual Activity  . Alcohol use: Not Currently  . Drug use: Never  . Sexual activity: Yes    Birth control/protection: None    Comment: Both tubes removed with ectopic pregnancy during current pregnancy  Other Topics Concern  . Not on file  Social History Narrative  . Not on file   Social Determinants of Health   Financial Resource Strain:   . Difficulty of Paying Living Expenses:   Food Insecurity:   . Worried About Programme researcher, broadcasting/film/video in the  Last Year:   . [place] in the Last Year:   Transportation Needs:   . Film/video editor (Medical):   Marland Kitchen Lack of Transportation (Non-Medical):   Physical Activity:   . Days of Exercise per Week:   . Minutes of Exercise per Session:   Stress:   . Feeling of Stress :   Social Connections:   . Frequency of Communication with Friends and Family:   . Frequency of Social Gatherings with Friends and Family:   . Attends Religious Services:   . Active Member  of Clubs or Organizations:   . Attends Archivist Meetings:   Marland Kitchen Marital Status:   Intimate Partner Violence:   . Fear of Current or Ex-Partner:   . Emotionally Abused:   Marland Kitchen Physically Abused:   . Sexually Abused:     No family history on file.   Review of Systems  Constitutional: Negative for chills and fever.  HENT: Negative for congestion, ear discharge, ear pain, hearing loss, sinus pain and sore throat.   Eyes: Negative for blurred vision and double vision.  Respiratory: Negative for cough, shortness of breath and wheezing.   Cardiovascular: Negative for chest pain, palpitations and leg swelling.  Gastrointestinal: Negative for abdominal pain, blood in stool, constipation, diarrhea, heartburn, melena, nausea and vomiting.  Genitourinary: Negative for dysuria, flank pain, frequency, hematuria and urgency.  Musculoskeletal: Negative for back pain, joint pain and myalgias.       Positive for low-mid pelvic and upper thigh pain  Skin: Negative for itching and rash.  Neurological: Negative for dizziness, tingling, tremors, sensory change, speech change, focal weakness, seizures, loss of consciousness, weakness and headaches.  Endo/Heme/Allergies: Negative for environmental allergies. Does not bruise/bleed easily.  Psychiatric/Behavioral: Negative for depression, hallucinations, memory loss, substance abuse and suicidal ideas. The patient is not nervous/anxious and does not have insomnia.      Physical Exam: BP 113/85   Pulse 79   Temp 98 F (36.7 C) (Oral)   Resp 16   Ht 5' (1.524 m)   Wt 71.2 kg   LMP 07/24/2019 (Exact Date)   BMI 30.66 kg/m   Constitutional: Well nourished, well developed female in no acute distress.  HEENT: normal Skin: Warm and dry.  Cardiovascular: Regular rate and rhythm.   Extremity: no edema  Respiratory: Clear to auscultation bilateral. Normal respiratory effort Abdomen: FHT present, tender to palpation over pubis symphysis Back: no  CVAT Neuro: DTRs 2+, Cranial nerves grossly intact Psych: Alert and Oriented x3. No memory deficits. Normal mood and affect.  MS: difficulty moving lower extremities for pelvic exam  Pelvic exam: (female chaperone present) is not limited by body habitus EGBUS: within normal limits Vagina: within normal limits and with normal mucosa  Cervix: posterior and difficult to reach   Fetal Well Being: 135 bpm, moderate variability, +accelerations, -decelerations Toco: uterine irritability and occasional contraction   Consults: None  Significant Findings/ Diagnostic Studies: none  Procedures: NST  Hospital Course: The patient was admitted to Labor and Delivery Triage for observation.   Discharge Condition: good  Disposition: Discharge disposition: 01-Home or Self Care  Diet: Regular diet  Discharge Activity: Activity as tolerated  Discharge Instructions    Discharge activity:   Complete by: As directed    As tolerated   Discharge diet:  No restrictions   Complete by: As directed    Fetal Kick Count:  Lie on our left side for one hour after a meal, and count the  number of times your baby kicks.  If it is less than 5 times, get up, move around and drink some juice.  Repeat the test 30 minutes later.  If it is still less than 5 kicks in an hour, notify your doctor.   Complete by: As directed    LABOR:  When conractions begin, you should start to time them from the beginning of one contraction to the beginning  of the next.  When contractions are 5 - 10 minutes apart or less and have been regular for at least an hour, you should call your health care provider.   Complete by: As directed    No sexual activity restrictions   Complete by: As directed    Notify physician for bleeding from the vagina   Complete by: As directed    Notify physician for blurring of vision or spots before the eyes   Complete by: As directed    Notify physician for chills or fever   Complete by: As directed     Notify physician for fainting spells, "black outs" or loss of consciousness   Complete by: As directed    Notify physician for increase in vaginal discharge   Complete by: As directed    Notify physician for leaking of fluid   Complete by: As directed    Notify physician for pain or burning when urinating   Complete by: As directed    Notify physician for pelvic pressure (sudden increase)   Complete by: As directed    Notify physician for severe or continued nausea or vomiting   Complete by: As directed    Notify physician for sudden gushing of fluid from the vagina (with or without continued leaking)   Complete by: As directed    Notify physician for sudden, constant, or occasional abdominal pain   Complete by: As directed    Notify physician if baby moving less than usual   Complete by: As directed      Allergies as of 04/13/2020      Reactions   Tramadol Swelling      Medication List    TAKE these medications   acetaminophen 500 MG tablet Commonly known as: TYLENOL Take 2 tablets (1,000 mg total) by mouth every 6 (six) hours as needed for fever or headache.   ondansetron 8 MG tablet Commonly known as: ZOFRAN Take 8 mg by mouth every 8 (eight) hours as needed for nausea or vomiting.   oxyCODONE 5 MG immediate release tablet Commonly known as: Roxicodone Take 1 tablet (5 mg total) by mouth every 6 (six) hours as needed for up to 3 days.   Prenatal Gummies/DHA & FA 0.4-32.5 MG Chew Chew 2 Pieces by mouth daily.   promethazine 25 MG tablet Commonly known as: PHENERGAN Take 25 mg by mouth every 6 (six) hours as needed for nausea or vomiting.   scopolamine 1 MG/3DAYS Commonly known as: TRANSDERM-SCOP Place 1 patch onto the skin every 3 (three) days.        Total time spent taking care of this patient: 20 minutes  Signed: Tresea Mall, CNM  04/13/2020, 2:12 PM

## 2020-04-13 NOTE — OB Triage Note (Signed)
Pt is a [id number] at [redacted]w[redacted]d that presents from ED with c/o pelvic pressure that started last night. Pt states "The pain is a sharp, stabbing, pressure and I can't close my legs , walk or lift my legs." Pt states the constant and rated 9/10 and over the counter tylenol and 2 benadryl did not help the pain. Vitals WDL Initial FHT 135 and assessing.

## 2020-04-13 NOTE — Discharge Instructions (Signed)
Wear hip/pelvic support belt Heat or ice to affected area Pain medication for severe pain Follow up with care provider for further plan

## 2020-04-13 NOTE — OB Triage Note (Signed)
Discharge orders received. Discharge instructions reviewed with the patient and pt verbalized understanding of instructions. Pt to fill prescription for pain medication and call her OB to schedule follow up appointment.

## 2020-05-02 ENCOUNTER — Encounter: Payer: Self-pay | Admitting: Obstetrics & Gynecology

## 2020-05-02 ENCOUNTER — Other Ambulatory Visit: Payer: Self-pay

## 2020-05-02 ENCOUNTER — Observation Stay
Admission: EM | Admit: 2020-05-02 | Discharge: 2020-05-02 | Disposition: A | Discharge status: Home / Self Care | Payer: Medicaid Other | Attending: Obstetrics & Gynecology | Admitting: Obstetrics & Gynecology

## 2020-05-02 DIAGNOSIS — R519 Headache, unspecified: Secondary | ICD-10-CM | POA: Insufficient documentation

## 2020-05-02 DIAGNOSIS — Z885 Allergy status to narcotic agent status: Secondary | ICD-10-CM | POA: Insufficient documentation

## 2020-05-02 DIAGNOSIS — O9089 Other complications of the puerperium, not elsewhere classified: Secondary | ICD-10-CM | POA: Diagnosis present

## 2020-05-02 DIAGNOSIS — Z888 Allergy status to other drugs, medicaments and biological substances status: Secondary | ICD-10-CM | POA: Diagnosis not present

## 2020-05-02 DIAGNOSIS — R030 Elevated blood-pressure reading, without diagnosis of hypertension: Secondary | ICD-10-CM | POA: Insufficient documentation

## 2020-05-02 LAB — CBC
HCT: 31 % — ABNORMAL LOW (ref 36.0–46.0)
Hemoglobin: 10.3 g/dL — ABNORMAL LOW (ref 12.0–15.0)
MCH: 28.1 pg (ref 26.0–34.0)
MCHC: 33.2 g/dL (ref 30.0–36.0)
MCV: 84.7 fL (ref 80.0–100.0)
Platelets: 489 10*3/uL — ABNORMAL HIGH (ref 150–400)
RBC: 3.66 MIL/uL — ABNORMAL LOW (ref 3.87–5.11)
RDW: 13.5 % (ref 11.5–15.5)
WBC: 7.2 10*3/uL (ref 4.0–10.5)
nRBC: 0 % (ref 0.0–0.2)

## 2020-05-02 LAB — COMPREHENSIVE METABOLIC PANEL WITH GFR
ALT: 18 U/L (ref 0–44)
AST: 21 U/L (ref 15–41)
Albumin: 3.3 g/dL — ABNORMAL LOW (ref 3.5–5.0)
Alkaline Phosphatase: 112 U/L (ref 38–126)
Anion gap: 7 (ref 5–15)
BUN: 9 mg/dL (ref 6–20)
CO2: 26 mmol/L (ref 22–32)
Calcium: 8.6 mg/dL — ABNORMAL LOW (ref 8.9–10.3)
Chloride: 106 mmol/L (ref 98–111)
Creatinine, Ser: 0.86 mg/dL (ref 0.44–1.00)
GFR calc Af Amer: >60 mL/min
GFR calc non Af Amer: >60 mL/min
Glucose, Bld: 106 mg/dL — ABNORMAL HIGH (ref 70–99)
Potassium: 3.9 mmol/L (ref 3.5–5.1)
Sodium: 139 mmol/L (ref 135–145)
Total Bilirubin: 0.4 mg/dL (ref 0.3–1.2)
Total Protein: 7.2 g/dL (ref 6.5–8.1)

## 2020-05-02 LAB — PROTEIN / CREATININE RATIO, URINE
Creatinine, Urine: 88 mg/dL
Total Protein, Urine: <6 mg/dL

## 2020-05-02 MED ORDER — DIPHENHYDRAMINE HCL 25 MG PO CAPS
25.0000 mg | ORAL_CAPSULE | Freq: Once | ORAL | Status: AC
  Administered 2020-05-02: 25 mg via ORAL
  Filled 2020-05-02: qty 1

## 2020-05-02 MED ORDER — PROCHLORPERAZINE MALEATE 10 MG PO TABS
10.0000 mg | ORAL_TABLET | Freq: Once | ORAL | Status: AC
  Administered 2020-05-02: 10 mg via ORAL
  Filled 2020-05-02: qty 1

## 2020-05-02 MED ORDER — KETOROLAC TROMETHAMINE 10 MG PO TABS
10.0000 mg | ORAL_TABLET | Freq: Once | ORAL | Status: AC
  Administered 2020-05-02: 10 mg via ORAL
  Filled 2020-05-02: qty 1

## 2020-05-02 NOTE — Progress Notes (Signed)
Pt request to leave. Pt was educated by Charity fundraiser, provider contacted and provider came to the bedside to speak with the pt.

## 2020-05-02 NOTE — Discharge Summary (Signed)
Physician Final Progress Note  Patient ID: Jeanne Tyler MRN: [id number] DOB/AGE: 29/12/1990 29 y.o.  Admit date: 05/02/2020 Admitting provider: Tresea MallJane Lissette Schenk, CNM Discharge date: 05/02/2020   Admission Diagnoses: headache, 10 days postpartum  Discharge Diagnoses:  Active Problems: Headache in pregnancy, postpartum S/P cesarean delivery 10 days Lactating Normal labs Elevated blood pressure mild range   History of Present Illness: The patient is a 29 y.o. female [phone], 10 days [facility]  who presents for left sided headache that started on 04/30/2020 and has persisted. She has taken tylenol and BC powder without relief. She rates the pain at 9/10 on admission. She was admitted for observation, vitals, labs.   She was found to have normal PIH labs, however her blood pressure has been elevated in the mild range during her visit. A review of her chart shows no history of elevated blood pressure. The patient's partner and newborn have been waiting outside in the car as newborn is unable to be in the room with her and she is anxious to leave. Unfortunately the medication administered for headache has not had a chance to be effective prior to patient stating she is going to leave. Advised patient that I was hoping to see if the pain medication would have an effect on her blood pressure. She received benadryl 25 mg, compazine 10 mg and toradol 10 mg. Pain level is 8/10 at time of discharge. She states [facility]. She is discharged to home with instructions to be seen by her provider for treatment of headache/elevated BP. Preeclampsia precautions are reviewed with the patient including risk for seizure or stroke.  History reviewed.  Her pregnancy was complicated by right ectopic pregnancy with salpingectomy and had previous left salpingectomy for ectopic, low back and pelvic pain,  Hyperemesis, chlamydia infection, insomnia,  Rh negative, previous c/section.  Past Surgical History:  Procedure Laterality Date  . CESAREAN SECTION  2012   fetal distress  . LAPAROSCOPIC UNILATERAL SALPINGECTOMY Right 10/03/2019   Procedure: LAPAROSCOPIC UNILATERAL SALPINGECTOMY;  Surgeon: Vena AustriaStaebler, Andreas, MD;  Location: [facility] ORS;  Service: Gynecology;  Laterality: Right;  . LAPAROSCOPIC UNILATERAL SALPINGECTOMY Left 2018   ectopic pregnancy  . LAPAROSCOPY N/A 10/03/2019   Procedure: LAPAROSCOPY DIAGNOSTIC;  Surgeon: Vena AustriaStaebler, Andreas, MD;  Location: [facility] ORS;  Service: Gynecology;  Laterality: N/A;    No current facility-administered medications on file prior to encounter.   Current Outpatient Medications on File Prior to Encounter  Medication Sig Dispense Refill  . acetaminophen (TYLENOL) 500 MG tablet Take 2 tablets (1,000 mg total) by mouth every 6 (six) hours as needed for fever or headache. 30 tablet 0  . ondansetron (ZOFRAN) 8 MG tablet Take 8 mg by mouth every 8 (eight) hours as needed for nausea or vomiting. (Patient not taking: Reported on 05/02/2020)    . Prenatal MV-Min-FA-Omega-3 (PRENATAL GUMMIES/DHA & FA) 0.4-32.5 MG CHEW Chew 2 Pieces by mouth daily. (Patient not taking: Reported on 05/02/2020)    . promethazine (PHENERGAN) 25 MG tablet Take 25 mg by mouth every 6 (six) hours as needed for nausea or vomiting. (Patient not taking: Reported on 05/02/2020)    . scopolamine (TRANSDERM-SCOP) 1 MG/3DAYS Place 1 patch onto the skin every 3 (three) days. (Patient not taking: Reported on 05/02/2020)      Allergies  Allergen Reactions  . Tramadol Swelling    Social History   Socioeconomic History  . Marital status: Significant Other  Spouse name: Elijah  . Number of children: Not on file  . Years of education: Not on file  . Highest education level: Not on file  Occupational History  . Not on file  Tobacco Use  . Smoking status: Never Smoker  . Smokeless tobacco: Never Used  Vaping Use  . Vaping Use: Never  used  Substance and Sexual Activity  . Alcohol use: Not Currently  . Drug use: Never  . Sexual activity: Yes    Birth control/protection: None    Comment: Both tubes removed with ectopic pregnancy during current pregnancy  Other Topics Concern  . Not on file  Social History Narrative  . Not on file   Social Determinants of Health   Financial Resource Strain:   . Difficulty of Paying Living Expenses:   Food Insecurity:   . Worried About Programme researcher, broadcasting/film/video in the Last Year:   . [place] in the Last Year:   Transportation Needs:   . Freight forwarder (Medical):   Marland Kitchen Lack of Transportation (Non-Medical):   Physical Activity:   . Days of Exercise per Week:   . Minutes of Exercise per Session:   Stress:   . Feeling of Stress :   Social Connections:   . Frequency of Communication with Friends and Family:   . Frequency of Social Gatherings with Friends and Family:   . Attends Religious Services:   . Active Member of Clubs or Organizations:   . Attends Banker Meetings:   Marland Kitchen Marital Status:   Intimate Partner Violence:   . Fear of Current or Ex-Partner:   . Emotionally Abused:   Marland Kitchen Physically Abused:   . Sexually Abused:     History reviewed. No pertinent family history.   Review of Systems  Constitutional: Negative for chills and fever.  HENT: Negative for congestion, ear discharge, ear pain, hearing loss, sinus pain and sore throat.   Eyes: Negative for blurred vision and double vision.  Respiratory: Negative for cough, shortness of breath and wheezing.   Cardiovascular: Negative for chest pain, palpitations and leg swelling.  Gastrointestinal: Negative for abdominal pain, blood in stool, constipation, diarrhea, heartburn, melena, nausea and vomiting.  Genitourinary: Negative for dysuria, flank pain, frequency, hematuria and urgency.  Musculoskeletal: Negative for back pain, joint pain and myalgias.  Skin: Negative for itching and rash.   Neurological: Positive for headaches. Negative for dizziness, tingling, tremors, sensory change, speech change, focal weakness, seizures, loss of consciousness and weakness.  Endo/Heme/Allergies: Negative for environmental allergies. Does not bruise/bleed easily.  Psychiatric/Behavioral: Negative for depression, hallucinations, memory loss, substance abuse and suicidal ideas. The patient is not nervous/anxious and does not have insomnia.      Physical Exam: BP (!) 149/89   Pulse (!) 59   LMP 07/24/2019 (Exact Date)   Constitutional: Well nourished, well developed female in no acute distress.  HEENT: normal Skin: Warm and dry.  Cardiovascular: Regular rate and rhythm.   Extremity: no edema  Respiratory: Clear to auscultation bilateral. Normal respiratory effort Abdomen: soft, nontender, nondistended, no abnormal masses, no epigastric pain Back: no CVAT Neuro: DTRs 2+, Cranial nerves grossly intact Psych: Alert and Oriented x3. No memory deficits. Normal mood and affect.   Consults: None  Significant Findings/ Diagnostic Studies: labs:  Results for ANNALY, SKOP (MRN [id number]) as of 05/02/2020 05:56  Ref. Range 05/02/2020 01:19 05/02/2020 01:39  COMPREHENSIVE METABOLIC PANEL Unknown Rpt (A)   Sodium Latest Ref Range: 135 - 145 mmol/L  139   Potassium Latest Ref Range: 3.5 - 5.1 mmol/L 3.9   Chloride Latest Ref Range: 98 - 111 mmol/L 106   CO2 Latest Ref Range: 22 - 32 mmol/L 26   Glucose Latest Ref Range: 70 - 99 mg/dL 944 (H)   BUN Latest Ref Range: 6 - 20 mg/dL 9   Creatinine Latest Ref Range: 0.44 - 1.00 mg/dL 9.67   Calcium Latest Ref Range: 8.9 - 10.3 mg/dL 8.6 (L)   Anion gap Latest Ref Range: 5 - 15  7   Alkaline Phosphatase Latest Ref Range: 38 - 126 U/L 112   Albumin Latest Ref Range: 3.5 - 5.0 g/dL 3.3 (L)   AST Latest Ref Range: 15 - 41 U/L 21   ALT Latest Ref Range: 0 - 44 U/L 18   Total Protein Latest Ref Range: 6.5 - 8.1 g/dL 7.2   Total Bilirubin Latest Ref  Range: 0.3 - 1.2 mg/dL 0.4   GFR, Est Non African American Latest Ref Range: >60 mL/min >60   GFR, Est African American Latest Ref Range: >60 mL/min >60   WBC Latest Ref Range: 4.0 - 10.5 K/uL 7.2   RBC Latest Ref Range: 3.87 - 5.11 MIL/uL 3.66 (L)   Hemoglobin Latest Ref Range: 12.0 - 15.0 g/dL 59.1 (L)   HCT Latest Ref Range: 36 - 46 % 31.0 (L)   MCV Latest Ref Range: 80.0 - 100.0 fL 84.7   MCH Latest Ref Range: 26.0 - 34.0 pg 28.1   MCHC Latest Ref Range: 30.0 - 36.0 g/dL 63.8   RDW Latest Ref Range: 11.5 - 15.5 % 13.5   Platelets Latest Ref Range: 150 - 400 K/uL 489 (H)   nRBC Latest Ref Range: 0.0 - 0.2 % 0.0   Total Protein, Urine Latest Units: mg/dL  <6  Protein Creatinine Ratio Latest Ref Range: 0.00 - 0.15 mg/mgCre  Pend  Creatinine, Urine Latest Units: mg/dL  88    Procedures: none  Hospital Course: The patient was admitted to Labor and Delivery Triage for observation.   Discharge Condition: fair, still has headache  Disposition: Discharge disposition: 01-Home or Self Care  Diet: Regular diet  Discharge Activity: Activity as tolerated  Discharge Instructions    Call MD for:   Complete by: As directed    Call MD for:  difficulty breathing, headache or visual disturbances   Complete by: As directed    Call MD for:  redness, tenderness, or signs of infection (pain, swelling, redness, odor or green/yellow discharge around incision site)   Complete by: As directed    Call MD for:  severe uncontrolled pain   Complete by: As directed    Call MD for:  temperature >100.4   Complete by: As directed    Diet regular   Complete by: As directed    Increase activity slowly   Complete by: As directed      Allergies as of 05/02/2020      Reactions   Tramadol Swelling      Medication List    STOP taking these medications   ondansetron 8 MG tablet Commonly known as: ZOFRAN   Prenatal Gummies/DHA & FA 0.4-32.5 MG Chew   promethazine 25 MG tablet Commonly known as:  PHENERGAN   scopolamine 1 MG/3DAYS Commonly known as: TRANSDERM-SCOP     TAKE these medications   acetaminophen 500 MG tablet Commonly known as: TYLENOL Take 2 tablets (1,000 mg total) by mouth every 6 (six) hours as needed for fever  or headache.      Follow up with care provider today  Total time spent taking care of this patient: 30 minutes  Signed: Tresea Mall, CNM  05/02/2020, 5:36 AM

## 2020-05-02 NOTE — OB Triage Note (Signed)
Pt is 29yo, delivered a boy on June 30. Pt stated she woke up with a headache on 04/30/20. Pt stated her headache continued to hurt all day 05/01/20. Pt took tylenol and a BC powder. Pain continued throughout the evening. Pt called her provider with [facility]. Pt was told to come to the hospital to be evaluated.

## 2020-08-21 IMAGING — US US OB COMP LESS 14 WK
1 series · 14 of 28 positions shown · non-contrast
Comparison: Obstetric ultrasound and pelvic MRI 10/03/2019

CLINICAL DATA: Pelvic pain. Status post salpingectomy secondary to
heterotopic pregnancy 1 week ago.

EXAM:
OBSTETRIC <14 WK ULTRASOUND
TECHNIQUE: Transabdominal ultrasound was performed for evaluation of the
gestation as well as the maternal uterus and adnexal regions.

[Series 1: us ob comp less 14 wk · 14 of 63 slices shown]
[im 3/63]
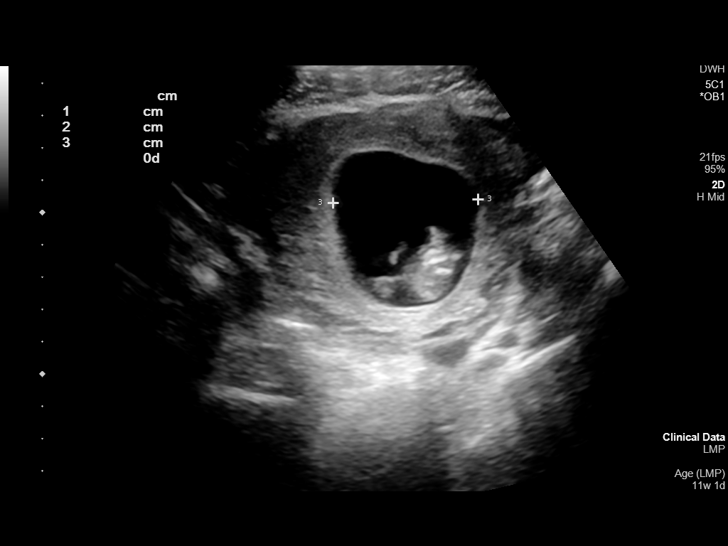
[im 7/63]
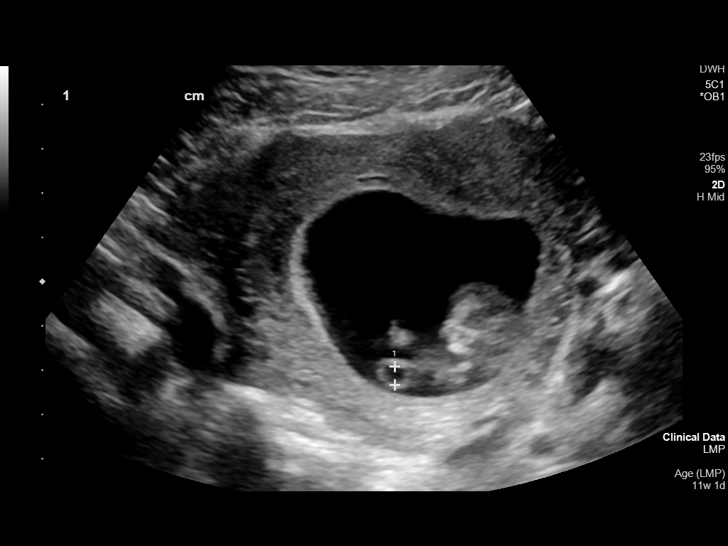
[im 12/63]
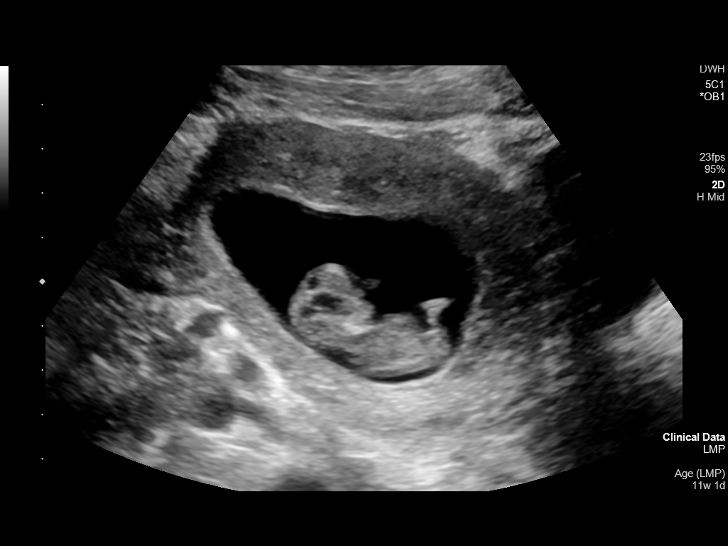
[im 17/63]
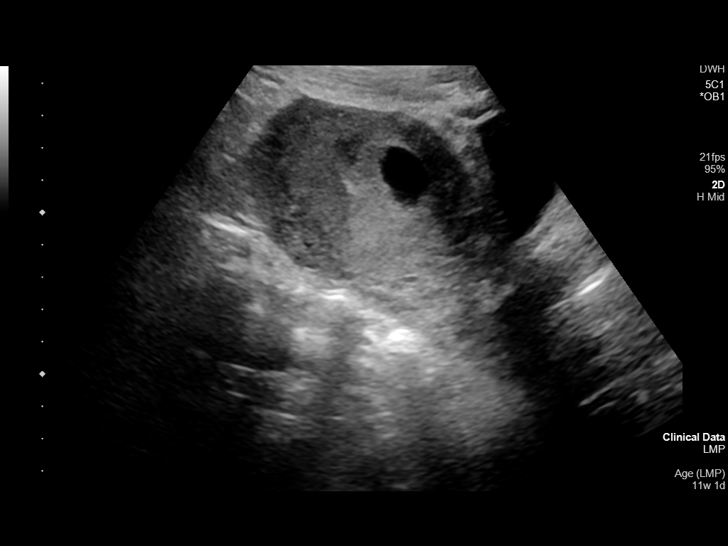
[im 21/63]
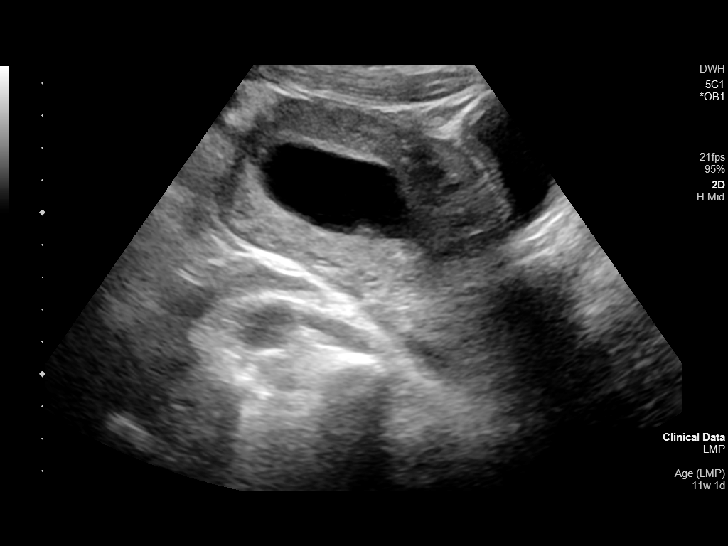
[im 26/63]
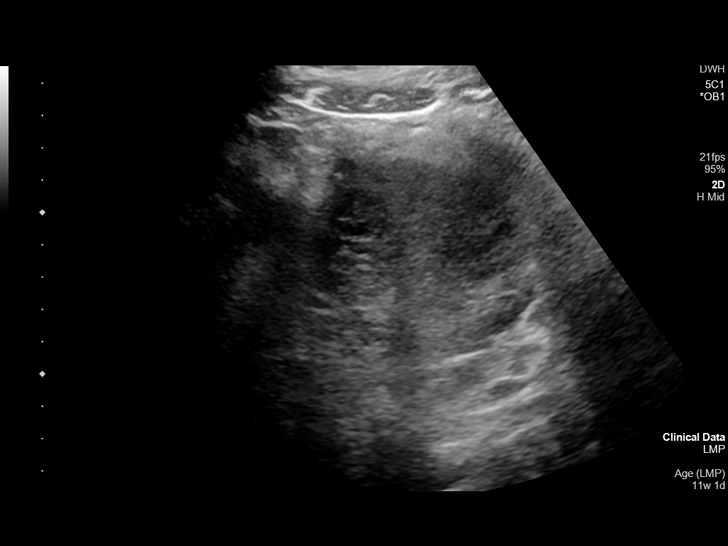
[im 30/63]
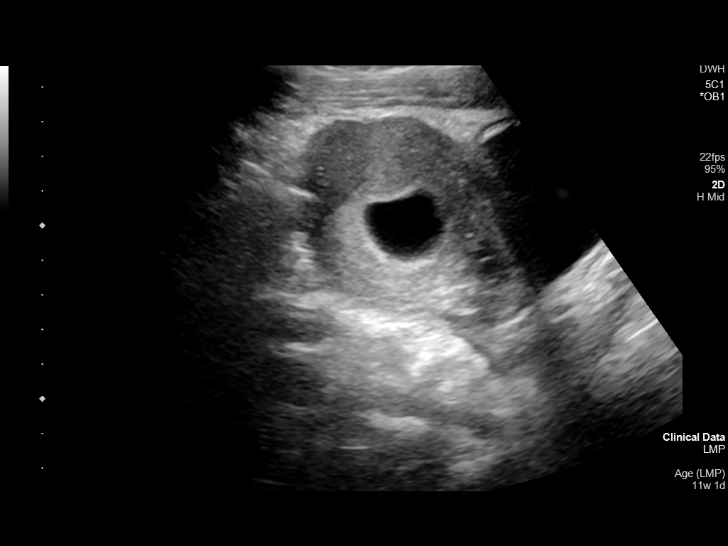
[im 35/63]
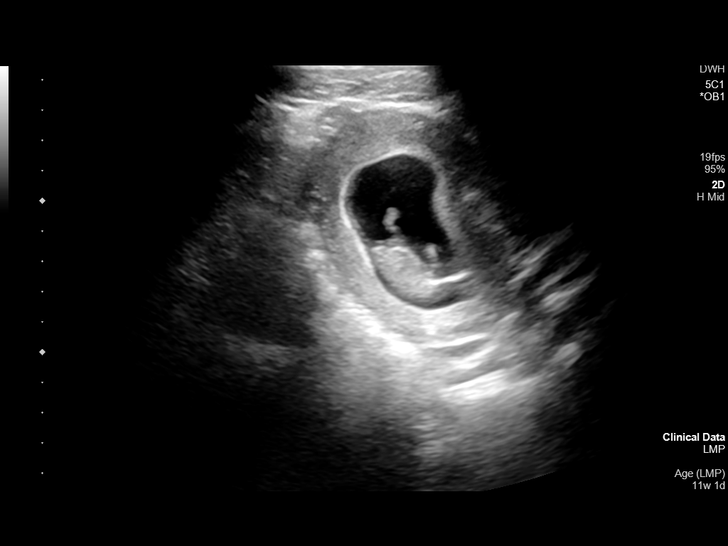
[im 40/63]
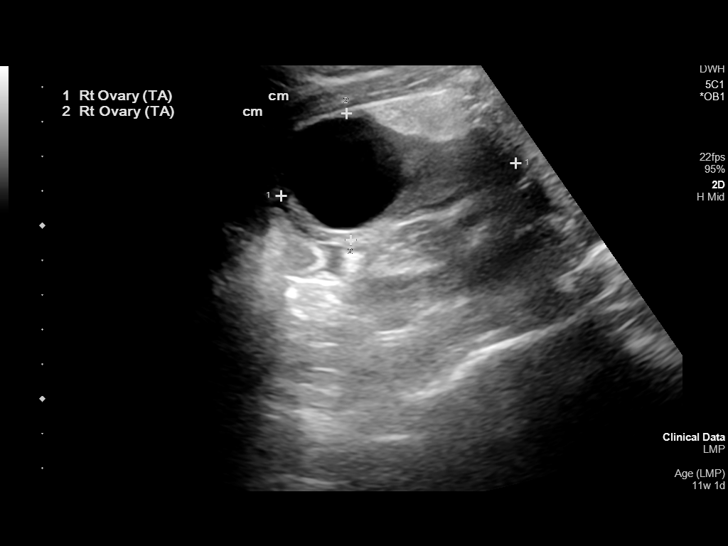
[im 44/63]
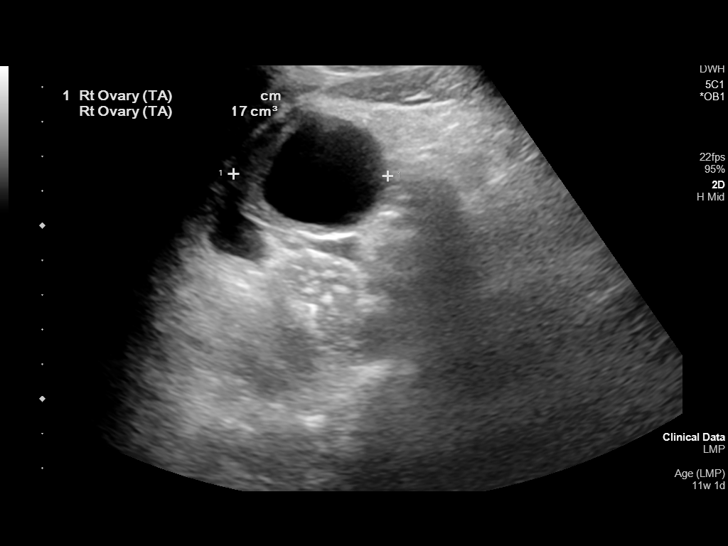
[im 49/63]
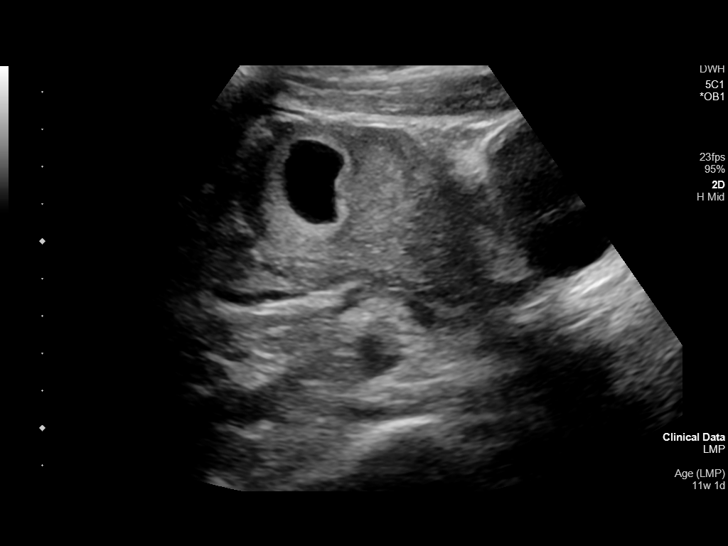
[im 53/63]
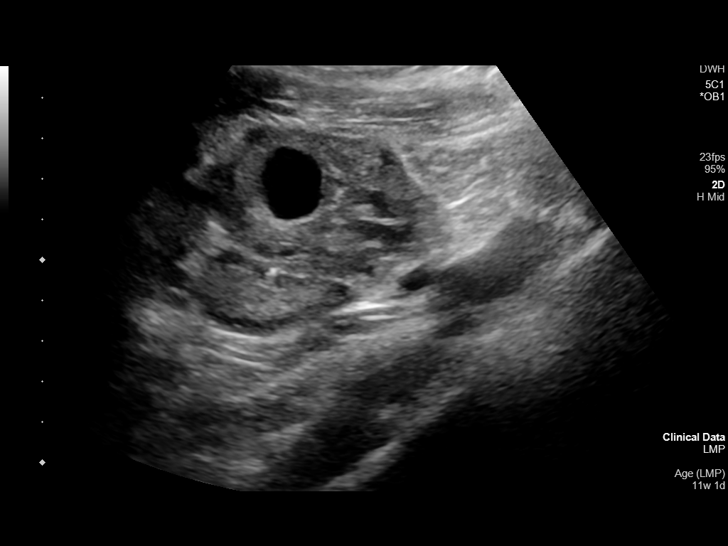
[im 58/63]
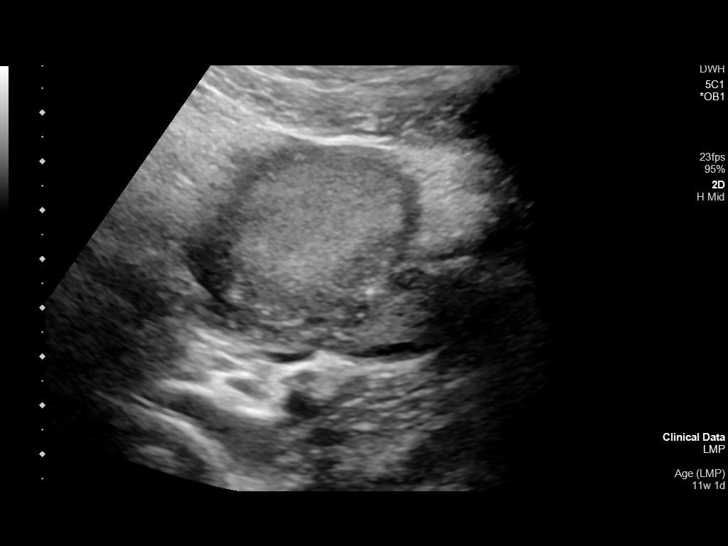
[im 63/63]
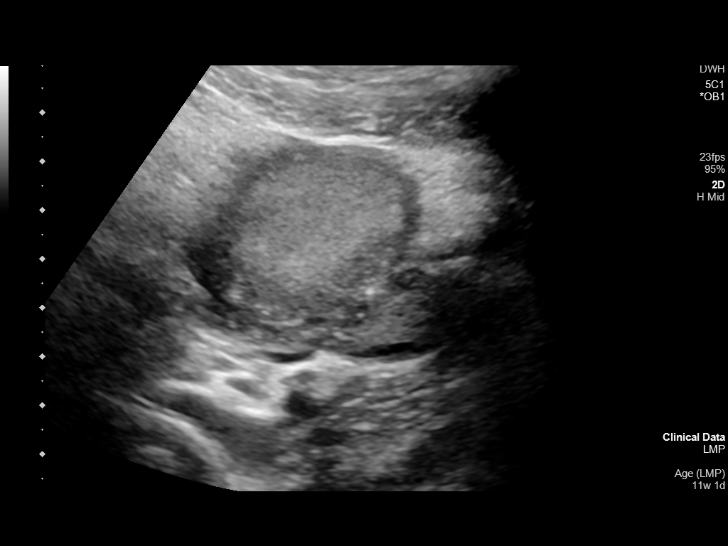

[14 of 28 positions shown; findings below may reference images not displayed]

FINDINGS: Intrauterine gestational sac: Single

Yolk sac:  Visualized.

Embryo:  Visualized.

Cardiac Activity: Visualized.

Heart Rate: 169 bpm

CRL:   37.3 mm   10 w 4 d                  US EDC: 05/03/2020

Subchorionic hemorrhage:  None visualized.

Maternal uterus/adnexae: Right ovary measures 6.8 x 3.7 x 4.5 cm and
contains a 3.8 cm simple cyst. The left ovary measures 3.8 x 2.0 x
2.3 cm and is normal. Small amount of free fluid in the right and
left lower quadrants. Fluid appears simple. No suspicious adnexal
mass.
IMPRESSION: 1. Single live intrauterine pregnancy estimated gestational age 10
weeks 4 days for ultrasound EDC 05/03/2020.
2. Small volume of simple free fluid in both adnexa. Right ovarian
cyst measuring 3.8 cm. Previous heterotopic pregnancy in the right
adnexa has been evacuated in the interim.

## 2021-11-05 ENCOUNTER — Other Ambulatory Visit: Payer: Self-pay

## 2021-11-05 ENCOUNTER — Emergency Department
Admission: EM | Admit: 2021-11-05 | Discharge: 2021-11-05 | Disposition: A | Discharge status: Home / Self Care | Payer: Medicaid Other | Attending: Emergency Medicine | Admitting: Emergency Medicine

## 2021-11-05 ENCOUNTER — Emergency Department: Payer: Medicaid Other

## 2021-11-05 DIAGNOSIS — S99921A Unspecified injury of right foot, initial encounter: Secondary | ICD-10-CM | POA: Diagnosis present

## 2021-11-05 DIAGNOSIS — S9031XA Contusion of right foot, initial encounter: Secondary | ICD-10-CM | POA: Diagnosis not present

## 2021-11-05 DIAGNOSIS — W208XXA Other cause of strike by thrown, projected or falling object, initial encounter: Secondary | ICD-10-CM | POA: Insufficient documentation

## 2021-11-05 MED ORDER — MELOXICAM 15 MG PO TABS: 15.0000 mg | ORAL_TABLET | Freq: Every day | ORAL | 1 refills | Status: AC

## 2021-11-05 NOTE — ED Provider Notes (Signed)
[facility] Provider Note  Patient Contact: 9:07 PM (approximate)   History   Foot Pain (Right foot )   HPI  Jeanne Tyler is a 31 y.o. female presents to the emergency department with right foot pain after patient dropped a glass table on her foot.  Patient has been able to bear weight easily since injury occurred.  No abrasions or lacerations.  No other alleviating measures have been attempted.     Physical Exam   Triage Vital Signs: ED Triage Vitals  Enc Vitals Group     BP 11/05/21 1928 115/84     Pulse Rate 11/05/21 1928 74     Resp 11/05/21 1928 16     Temp 11/05/21 1928 98.4 F (36.9 C)     Temp Source 11/05/21 1928 Oral     SpO2 11/05/21 1928 100 %     Weight 11/05/21 1929 134 lb (60.8 kg)     Height 11/05/21 1929 5\' 1"  (1.549 m)     Head Circumference --      Peak Flow --      Pain Score 11/05/21 1929 8     Pain Loc --      Pain Edu? --      Excl. in GC? --     Most recent vital signs: Vitals:   11/05/21 1928  BP: 115/84  Pulse: 74  Resp: 16  Temp: 98.4 F (36.9 C)  SpO2: 100%     General: Alert and in no acute distress. Eyes:  PERRL. EOMI. Head: No acute traumatic findings ENT:      Ears:       Nose: No congestion/rhinnorhea.      Mouth/Throat: Mucous membranes are moist. Neck: No stridor. No cervical spine tenderness to palpation. Cardiovascular:  Good peripheral perfusion Respiratory: Normal respiratory effort without tachypnea or retractions. Lungs CTAB. Good air entry to the bases with no decreased or absent breath sounds Gastrointestinal: Bowel sounds 4 quadrants. Soft and nontender to palpation. No guarding or rigidity. No palpable masses. No distention. No CVA tenderness. Musculoskeletal: Full range of motion to all extremities. Patient has tenderness to palpation on the posterior aspect of the right foot. Palpable dorsalis pedis pulse, right. Capillary refill less than 2 seconds on the right.  Neurologic:  No  gross focal neurologic deficits are appreciated.  Skin:   No rash noted Other:   ED Results / Procedures / Treatments   Labs (all labs ordered are listed, but only abnormal results are displayed) Labs Reviewed - No data to display    RADIOLOGY  I personally viewed and evaluated these images as part of my medical decision making, as well as reviewing the written report by the radiologist.  ED Provider Interpretation: No acute bony abnormality of the right foot.         MEDICATIONS ORDERED IN ED: Medications - No data to display   IMPRESSION / MDM / ASSESSMENT AND PLAN / ED COURSE  I reviewed the triage vital signs and the nursing notes.                              Differential diagnosis includes, but is not limited to, foot contusion, fracture  Assessment and plan Foot pain 31 year old female presents to the emergency department after a foot injury.  Vital signs are reassuring at triage.  On physical exam, patient was alert, active and nontoxic-appearing.  There were no bony  abnormalities visualized on x-ray of the right foot.  Patient was discharged with meloxicam.  She was advised to follow-up with primary care as needed.      FINAL CLINICAL IMPRESSION(S) / ED DIAGNOSES   Final diagnoses:  Contusion of right foot, initial encounter     Rx / DC Orders   ED Discharge Orders          Ordered    meloxicam (MOBIC) 15 MG tablet  Daily        11/05/21 2024             Note:  This document was prepared using Dragon voice recognition software and may include unintentional dictation errors.   Pia Mau [place], Cordelia Poche 11/05/21 2111    Concha Se, MD 11/06/21 [phone]

## 2021-11-05 NOTE — ED Notes (Signed)
Pt complains of right foot and toe pain

## 2021-11-05 NOTE — ED Triage Notes (Signed)
Pt presents to ER from home with c/o foot pain after accidentally dropping a glass top of a coffee table on her right foot. Pt states glass dropped right across top of her right foot in front of her toes.  Pt ambulatory to triage.

## 2021-11-05 NOTE — Discharge Instructions (Addendum)
Take Meloxicam once daily for pain and inflammation.  

## 2022-09-12 ENCOUNTER — Emergency Department: Payer: Medicaid Other

## 2022-09-12 ENCOUNTER — Other Ambulatory Visit: Payer: Self-pay

## 2022-09-12 ENCOUNTER — Emergency Department
Admission: EM | Admit: 2022-09-12 | Discharge: 2022-09-12 | Disposition: A | Discharge status: Home / Self Care | Payer: Medicaid Other | Attending: Emergency Medicine | Admitting: Emergency Medicine

## 2022-09-12 ENCOUNTER — Encounter: Payer: Self-pay | Admitting: Intensive Care

## 2022-09-12 DIAGNOSIS — M25561 Pain in right knee: Secondary | ICD-10-CM | POA: Diagnosis not present

## 2022-09-12 DIAGNOSIS — M25562 Pain in left knee: Secondary | ICD-10-CM | POA: Insufficient documentation

## 2022-09-12 MED ORDER — MELOXICAM 15 MG PO TABS: 15.0000 mg | ORAL_TABLET | Freq: Every day | ORAL | 0 refills | Status: AC

## 2022-09-12 NOTE — ED Triage Notes (Signed)
C/o right knee pain for months that has progressively gotten worse in the last week

## 2022-09-12 NOTE — ED Provider Triage Note (Signed)
Emergency Medicine Provider Triage Evaluation Note  Jeanne Tyler, a 31 y.o. female  was evaluated in triage.  Pt complains of right knee pain. She describes several months of remittent right knee pain.  She describes feeling as if her knee is going to give way when she does bearing weight.  She denies any previous evaluation for knee pain.  She reports taking intermittent doses of acetaminophen and muscle relaxant for relief.  Denies a history of chronic ongoing knee pain.  Review of Systems  Positive: Right knee pain Negative: FCS  Physical Exam  Temp 98.2 F (36.8 C) (Oral)   Ht 5\' 1"  (1.549 m)   Wt 69.9 kg   BMI 29.10 kg/m  Gen:   Awake, no distress  NAD Resp:  Normal effort CTA MSK:   Moves extremities without difficulty. Right knee with normal ROM Other:    Medical Decision Making  Medically screening exam initiated at 6:09 PM.  Appropriate orders placed.  Jeanne Tyler was informed that the remainder of the evaluation will be completed by another provider, this initial triage assessment does not replace that evaluation, and the importance of remaining in the ED until their evaluation is complete.  Patient to the ED for evaluation of acute on chronic right knee pain.   Jeanne Patience, PA-C 09/12/22 1818

## 2022-09-12 NOTE — ED Provider Notes (Signed)
[facility] Provider Note  Patient Contact: 7:32 PM (approximate)   History   Knee Pain   HPI  Jeanne Tyler is a 31 y.o. female presents to the emergency department with progressively worsening right knee pain.  Patient has been symptomatic for the past several months but reports that symptoms have been worse for the past..  Patient has had difficulty extending right knee and states that her pain is mostly posterior in nature.  Patient does use birth control.  Denies daily smoking.  No prior history of DVT or PE.      Physical Exam   Triage Vital Signs: ED Triage Vitals  Enc Vitals Group     BP 09/12/22 1811 (!) 125/94     Pulse Rate 09/12/22 1811 81     Resp 09/12/22 1811 16     Temp 09/12/22 1807 98.2 F (36.8 C)     Temp Source 09/12/22 1807 Oral     SpO2 09/12/22 1811 100 %     Weight 09/12/22 1809 154 lb (69.9 kg)     Height 09/12/22 1809 5\' 1"  (1.549 m)     Head Circumference --      Peak Flow --      Pain Score 09/12/22 1807 8     Pain Loc --      Pain Edu? --      Excl. in GC? --     Most recent vital signs: Vitals:   09/12/22 1811 09/12/22 2144  BP: (!) 125/94 126/70  Pulse: 81 74  Resp: 16 19  Temp:  98 F (36.7 C)  SpO2: 100% 98%     General: Alert and in no acute distress. Eyes:  PERRL. EOMI. Head: No acute traumatic findings ENT:      Nose: No congestion/rhinnorhea.      Mouth/Throat: Mucous membranes are moist. Neck: No stridor. No cervical spine tenderness to palpation. Cardiovascular:  Good peripheral perfusion Respiratory: Normal respiratory effort without tachypnea or retractions. Lungs CTAB. Good air entry to the bases with no decreased or absent breath sounds. Gastrointestinal: Bowel sounds 4 quadrants. Soft and nontender to palpation. No guarding or rigidity. No palpable masses. No distention. No CVA tenderness. Musculoskeletal: Patient performs limited range of motion at the right knee. Neurologic:  No  gross focal neurologic deficits are appreciated.  Skin:   No rash noted Other:   ED Results / Procedures / Treatments   Labs (all labs ordered are listed, but only abnormal results are displayed) Labs Reviewed - No data to display      RADIOLOGY  I personally viewed and evaluated these images as part of my medical decision making, as well as reviewing the written report by the radiologist.  ED Provider Interpretation: Venous ultrasound shows no signs of DVT.  Patient has degenerative patellar changes but no other acute abnormality on x-ray.   PROCEDURES:  Critical Care performed: No  Procedures   MEDICATIONS ORDERED IN ED: Medications - No data to display   IMPRESSION / MDM / ASSESSMENT AND PLAN / ED COURSE  I reviewed the triage vital signs and the nursing notes.                              Assessment and plan Right knee pain 31 year old female presents to the emergency department with acute right knee pain.  Vital signs are reassuring at triage.  On exam, patient alert, active and nontoxic-appearing.  Will obtain venous ultrasound given posterior nature of patient's right knee pain and x-ray and will reassess.  Venous ultrasound shows no signs of DVT.  Patient has some patellofemoral arthritic changes on x-ray but no other acute abnormality.  Patient was prescribed meloxicam for pain and inflammation advised to keep knee elevated at home and apply ice.  She was given a referral to orthopedics for follow-up.     FINAL CLINICAL IMPRESSION(S) / ED DIAGNOSES   Final diagnoses:  Acute pain of left knee     Rx / DC Orders   ED Discharge Orders          Ordered    meloxicam (MOBIC) 15 MG tablet  Daily        09/12/22 2139             Note:  This document was prepared using Dragon voice recognition software and may include unintentional dictation errors.   Pia Mau [place], Cordelia Poche 09/12/22 2154    Jene Every, MD 09/12/22 2215

## 2022-09-12 NOTE — Discharge Instructions (Signed)
Take meloxicam once daily. Keep knee elevated at home. Apply ice. Please follow-up with Dr. Joice Lofts if symptoms persist.

## 2022-09-18 ENCOUNTER — Other Ambulatory Visit: Payer: Self-pay

## 2022-09-18 ENCOUNTER — Emergency Department
Admission: EM | Admit: 2022-09-18 | Discharge: 2022-09-18 | Discharge status: Against Medical Advice | Payer: Medicaid Other | Attending: Emergency Medicine | Admitting: Emergency Medicine

## 2022-09-18 ENCOUNTER — Emergency Department: Payer: Medicaid Other

## 2022-09-18 DIAGNOSIS — U071 COVID-19: Secondary | ICD-10-CM | POA: Insufficient documentation

## 2022-09-18 DIAGNOSIS — R059 Cough, unspecified: Secondary | ICD-10-CM | POA: Diagnosis not present

## 2022-09-18 DIAGNOSIS — R0602 Shortness of breath: Secondary | ICD-10-CM | POA: Diagnosis not present

## 2022-09-18 LAB — COMPREHENSIVE METABOLIC PANEL
ALT: 23 U/L (ref 0–44)
AST: 23 U/L (ref 15–41)
Albumin: 4.4 g/dL (ref 3.5–5.0)
Alkaline Phosphatase: 68 U/L (ref 38–126)
Anion gap: 6 (ref 5–15)
BUN: 11 mg/dL (ref 6–20)
CO2: 26 mmol/L (ref 22–32)
Calcium: 9.2 mg/dL (ref 8.9–10.3)
Chloride: 107 mmol/L (ref 98–111)
Creatinine, Ser: 0.93 mg/dL (ref 0.44–1.00)
GFR, Estimated: >60 mL/min (ref >60)
Glucose, Bld: 136 mg/dL — ABNORMAL HIGH (ref 70–99)
Potassium: 3.2 mmol/L — ABNORMAL LOW (ref 3.5–5.1)
Sodium: 139 mmol/L (ref 135–145)
Total Bilirubin: 0.6 mg/dL (ref 0.3–1.2)
Total Protein: 8.4 g/dL — ABNORMAL HIGH (ref 6.5–8.1)

## 2022-09-18 LAB — CBC
HCT: 41.6 % (ref 36.0–46.0)
Hemoglobin: 14.1 g/dL (ref 12.0–15.0)
MCH: 29.8 pg (ref 26.0–34.0)
MCHC: 33.9 g/dL (ref 30.0–36.0)
MCV: 87.9 fL (ref 80.0–100.0)
Platelets: 308 10*3/uL (ref 150–400)
RBC: 4.73 MIL/uL (ref 3.87–5.11)
RDW: 12.2 % (ref 11.5–15.5)
WBC: 4.7 10*3/uL (ref 4.0–10.5)
nRBC: 0 % (ref 0.0–0.2)

## 2022-09-18 LAB — RESP PANEL BY RT-PCR (FLU A&B, COVID) ARPGX2
Influenza A by PCR: NEGATIVE
Influenza B by PCR: NEGATIVE
SARS Coronavirus 2 by RT PCR: POSITIVE — AB

## 2022-09-18 LAB — TROPONIN I (HIGH SENSITIVITY): Troponin I (High Sensitivity): <2 ng/L (ref <18)

## 2022-09-18 MED ORDER — ACETAMINOPHEN 325 MG PO TABS
650.0000 mg | ORAL_TABLET | Freq: Once | ORAL | Status: AC
  Administered 2022-09-18: 650 mg via ORAL
  Filled 2022-09-18: qty 2
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
## 2022-09-18 NOTE — ED Triage Notes (Signed)
Pt states she took a covid test a couple days ago and it came back neg but pt states her chest feels tight and she has a hard time breathing- pt states she does have a cough and some nausea- pt states she also has pain in her back and neck

## 2022-09-18 NOTE — ED Provider Triage Note (Signed)
  Emergency Medicine Provider Triage Evaluation Note  Jeanne Tyler , a 31 y.o.female,  was evaluated in triage.  Pt complains of shortness of breath.  She states that she been having chest tightness and difficulty breathing, as well as cough and nausea for the past few days.  She took a COVID test couple days ago, which was reportedly negative.  She states that she is also having body aches as well.   Review of Systems  Positive: Cough, myalgias, shortness of breath Negative: Denies fever, chest pain, vomiting  Physical Exam   Vitals:   09/18/22 1640 09/18/22 1642  BP: (!) 127/98   Pulse: (!) 108   Resp: 20   Temp:  99.8 F (37.7 C)  SpO2: 96%    Gen:   Awake, no distress   Resp:  Normal effort  MSK:   Moves extremities without difficulty  Other:    Medical Decision Making  Given the patient's initial medical screening exam, the following diagnostic evaluation has been ordered. The patient will be placed in the appropriate treatment space, once one is available, to complete the evaluation and treatment. I have discussed the plan of care with the patient and I have advised the patient that an ED physician or mid-level practitioner will reevaluate their condition after the test results have been received, as the results may give them additional insight into the type of treatment they may need.    Diagnostics: Labs, EKG, CXR, respiratory panel  Treatments: none immediately   Varney Daily, [place] 09/18/22 1730

## 2022-10-14 DIAGNOSIS — R102 Pelvic and perineal pain: Secondary | ICD-10-CM | POA: Diagnosis not present

## 2022-10-14 DIAGNOSIS — Z3042 Encounter for surveillance of injectable contraceptive: Secondary | ICD-10-CM | POA: Diagnosis not present

## 2022-11-29 DIAGNOSIS — Z113 Encounter for screening for infections with a predominantly sexual mode of transmission: Secondary | ICD-10-CM | POA: Diagnosis not present

## 2023-01-02 DIAGNOSIS — Z3042 Encounter for surveillance of injectable contraceptive: Secondary | ICD-10-CM | POA: Diagnosis not present

## 2023-02-20 DIAGNOSIS — N898 Other specified noninflammatory disorders of vagina: Secondary | ICD-10-CM | POA: Diagnosis not present

## 2023-02-20 DIAGNOSIS — Z113 Encounter for screening for infections with a predominantly sexual mode of transmission: Secondary | ICD-10-CM | POA: Diagnosis not present

## 2023-03-30 DIAGNOSIS — N760 Acute vaginitis: Secondary | ICD-10-CM | POA: Diagnosis not present

## 2023-03-30 DIAGNOSIS — B9689 Other specified bacterial agents as the cause of diseases classified elsewhere: Secondary | ICD-10-CM | POA: Diagnosis not present

## 2023-03-30 DIAGNOSIS — Z113 Encounter for screening for infections with a predominantly sexual mode of transmission: Secondary | ICD-10-CM | POA: Diagnosis not present

## 2023-04-03 DIAGNOSIS — Z3042 Encounter for surveillance of injectable contraceptive: Secondary | ICD-10-CM | POA: Diagnosis not present

## 2023-04-11 DIAGNOSIS — N920 Excessive and frequent menstruation with regular cycle: Secondary | ICD-10-CM | POA: Diagnosis not present

## 2023-04-16 DIAGNOSIS — Z202 Contact with and (suspected) exposure to infections with a predominantly sexual mode of transmission: Secondary | ICD-10-CM | POA: Diagnosis not present

## 2023-05-23 DIAGNOSIS — N920 Excessive and frequent menstruation with regular cycle: Secondary | ICD-10-CM | POA: Diagnosis not present

## 2023-06-20 DIAGNOSIS — Z113 Encounter for screening for infections with a predominantly sexual mode of transmission: Secondary | ICD-10-CM | POA: Diagnosis not present

## 2023-06-20 DIAGNOSIS — R300 Dysuria: Secondary | ICD-10-CM | POA: Diagnosis not present

## 2023-06-27 DIAGNOSIS — Z79899 Other long term (current) drug therapy: Secondary | ICD-10-CM | POA: Diagnosis not present

## 2023-06-27 DIAGNOSIS — X501XXA Overexertion from prolonged static or awkward postures, initial encounter: Secondary | ICD-10-CM | POA: Diagnosis not present

## 2023-06-27 DIAGNOSIS — S6992XA Unspecified injury of left wrist, hand and finger(s), initial encounter: Secondary | ICD-10-CM | POA: Diagnosis not present

## 2023-06-27 DIAGNOSIS — Z3042 Encounter for surveillance of injectable contraceptive: Secondary | ICD-10-CM | POA: Diagnosis not present

## 2023-06-27 DIAGNOSIS — Z885 Allergy status to narcotic agent status: Secondary | ICD-10-CM | POA: Diagnosis not present

## 2023-06-27 DIAGNOSIS — Y990 Civilian activity done for income or pay: Secondary | ICD-10-CM | POA: Diagnosis not present

## 2023-06-27 DIAGNOSIS — Z888 Allergy status to other drugs, medicaments and biological substances status: Secondary | ICD-10-CM | POA: Diagnosis not present

## 2023-06-27 DIAGNOSIS — S63611A Unspecified sprain of left index finger, initial encounter: Secondary | ICD-10-CM | POA: Diagnosis not present

## 2023-07-23 ENCOUNTER — Other Ambulatory Visit: Payer: Self-pay

## 2023-07-23 ENCOUNTER — Emergency Department: Payer: 59

## 2023-07-23 ENCOUNTER — Emergency Department
Admission: EM | Admit: 2023-07-23 | Discharge: 2023-07-23 | Disposition: A | Discharge status: Home / Self Care | Payer: 59 | Attending: Emergency Medicine | Admitting: Emergency Medicine

## 2023-07-23 DIAGNOSIS — W230XXA Caught, crushed, jammed, or pinched between moving objects, initial encounter: Secondary | ICD-10-CM | POA: Insufficient documentation

## 2023-07-23 DIAGNOSIS — S6992XA Unspecified injury of left wrist, hand and finger(s), initial encounter: Secondary | ICD-10-CM | POA: Diagnosis not present

## 2023-07-23 NOTE — ED Triage Notes (Signed)
Pt to ed from work via POV for left index finger injury. 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 Pt thinks she re-injured it today. Pt is caox4, in no acute distress and ambulatory in triage.
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
## 2023-07-23 NOTE — Discharge Instructions (Signed)
Please follow-up with orthopedics if your symptoms persist.  Please wear your splint to rest your tendon.  Please return for any new, worsening, or change in symptoms or other concerns.  It was a pleasure caring for you today.

## 2023-08-04 DIAGNOSIS — Z113 Encounter for screening for infections with a predominantly sexual mode of transmission: Secondary | ICD-10-CM | POA: Diagnosis not present

## 2023-08-28 ENCOUNTER — Telehealth: Payer: Self-pay

## 2023-08-28 NOTE — Telephone Encounter (Signed)
Transition Care Management Follow-up Telephone Call Date of discharge and from where: 07/23/2023 [facility] How have you been since you were released from the hospital? Patient stated that her pain has not improved. Any questions or concerns? No  Items Reviewed: Did the pt receive and understand the discharge instructions provided? Yes  Medications obtained and verified?  No medication prescribed. Other? No  Any new allergies since your discharge? No  Dietary orders reviewed? Yes Do you have support at home? Yes   Follow up appointments reviewed:  PCP Hospital f/u appt confirmed? No  Scheduled to see  on  @ . Specialist Hospital f/u appt confirmed?  Gave patient the number for Kennedy Bucker, MD. Patient stated she will follow-up and make an appointment  Scheduled to see  on  @ . Are transportation arrangements needed? No  If their condition worsens, is the pt aware to call PCP or go to the Emergency Dept.? Yes Was the patient provided with contact information for the PCP's office or ED? Yes Was to pt encouraged to call back with questions or concerns? Yes   Rilan Eiland [facility]  [facility], [facility] Guide Direct Dial: [phone]  Website: [email or web address]

## 2023-09-08 DIAGNOSIS — Z131 Encounter for screening for diabetes mellitus: Secondary | ICD-10-CM | POA: Diagnosis not present

## 2023-09-08 DIAGNOSIS — Z13220 Encounter for screening for lipoid disorders: Secondary | ICD-10-CM | POA: Diagnosis not present

## 2023-09-08 DIAGNOSIS — Z1331 Encounter for screening for depression: Secondary | ICD-10-CM | POA: Diagnosis not present

## 2023-09-08 DIAGNOSIS — Z1330 Encounter for screening examination for mental health and behavioral disorders, unspecified: Secondary | ICD-10-CM | POA: Diagnosis not present

## 2023-09-08 DIAGNOSIS — Z136 Encounter for screening for cardiovascular disorders: Secondary | ICD-10-CM | POA: Diagnosis not present

## 2023-09-08 DIAGNOSIS — Z0000 Encounter for general adult medical examination without abnormal findings: Secondary | ICD-10-CM | POA: Diagnosis not present

## 2023-09-12 DIAGNOSIS — Z3042 Encounter for surveillance of injectable contraceptive: Secondary | ICD-10-CM | POA: Diagnosis not present

## 2023-09-27 DIAGNOSIS — N898 Other specified noninflammatory disorders of vagina: Secondary | ICD-10-CM | POA: Diagnosis not present

## 2023-09-27 DIAGNOSIS — J029 Acute pharyngitis, unspecified: Secondary | ICD-10-CM | POA: Diagnosis not present

## 2023-09-27 DIAGNOSIS — Z113 Encounter for screening for infections with a predominantly sexual mode of transmission: Secondary | ICD-10-CM | POA: Diagnosis not present

## 2023-12-15 ENCOUNTER — Emergency Department: Payer: 59

## 2023-12-15 ENCOUNTER — Emergency Department
Admission: EM | Admit: 2023-12-15 | Discharge: 2023-12-15 | Disposition: A | Discharge status: Home / Self Care | Payer: 59 | Attending: Emergency Medicine | Admitting: Emergency Medicine

## 2023-12-15 ENCOUNTER — Other Ambulatory Visit: Payer: Self-pay

## 2023-12-15 DIAGNOSIS — Y92410 Unspecified street and highway as the place of occurrence of the external cause: Secondary | ICD-10-CM | POA: Diagnosis not present

## 2023-12-15 DIAGNOSIS — M546 Pain in thoracic spine: Secondary | ICD-10-CM | POA: Insufficient documentation

## 2023-12-15 DIAGNOSIS — M549 Dorsalgia, unspecified: Secondary | ICD-10-CM

## 2023-12-15 DIAGNOSIS — M545 Low back pain, unspecified: Secondary | ICD-10-CM | POA: Insufficient documentation

## 2023-12-15 NOTE — ED Notes (Signed)
Pt stated that she had her fallopian tubes removed and knows she is not pregnant. Pt declined urine pregnancy test. Radiology notified.

## 2023-12-15 NOTE — Discharge Instructions (Addendum)
Please take Tylenol 650 mg 4 times daily as well as ibuprofen 600 mg 3 times daily for pain and bodyaches.  Get plenty of rest.  Follow-up with primary care for further evaluation.  Return here for new or worse symptoms.
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
## 2023-12-15 NOTE — ED Notes (Signed)
Pt departed before receiving discharge instructions or discharge vitals.
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
## 2024-10-18 ENCOUNTER — Encounter: Payer: Self-pay | Admitting: Emergency Medicine

## 2024-10-18 ENCOUNTER — Other Ambulatory Visit: Payer: Self-pay

## 2024-10-18 ENCOUNTER — Emergency Department

## 2024-10-18 DIAGNOSIS — M791 Myalgia, unspecified site: Secondary | ICD-10-CM | POA: Insufficient documentation

## 2024-10-18 DIAGNOSIS — R509 Fever, unspecified: Secondary | ICD-10-CM | POA: Insufficient documentation

## 2024-10-18 DIAGNOSIS — Z5321 Procedure and treatment not carried out due to patient leaving prior to being seen by health care provider: Secondary | ICD-10-CM | POA: Insufficient documentation

## 2024-10-18 DIAGNOSIS — R112 Nausea with vomiting, unspecified: Secondary | ICD-10-CM | POA: Diagnosis not present

## 2024-10-18 MED ORDER — ACETAMINOPHEN 325 MG PO TABS
650.0000 mg | ORAL_TABLET | Freq: Once | ORAL | Status: AC | PRN
  Administered 2024-10-18: 650 mg via ORAL
  Filled 2024-10-18: qty 2

## 2024-10-18 MED ORDER — ONDANSETRON 4 MG PO TBDP
4.0000 mg | ORAL_TABLET | Freq: Once | ORAL | Status: AC | PRN
  Administered 2024-10-18: 4 mg via ORAL
  Filled 2024-10-18: qty 1

## 2024-10-18 NOTE — ED Triage Notes (Signed)
 Pt to ED via POV with c/o generalized body aches. Pt states +exposure to the flu, states the whole facility has it. Pt with noted fever on triage. Also c/o N/V. Pt appears uncomfortable in triage.

## 2024-10-19 ENCOUNTER — Emergency Department
Admission: EM | Admit: 2024-10-19 | Discharge: 2024-10-19 | Disposition: A | Discharge status: Home / Self Care | Attending: Emergency Medicine | Admitting: Emergency Medicine

## 2024-10-19 LAB — RESP PANEL BY RT-PCR (RSV, FLU A&B, COVID)  RVPGX2
Influenza A by PCR: NEGATIVE
Influenza B by PCR: NEGATIVE
Resp Syncytial Virus by PCR: NEGATIVE
SARS Coronavirus 2 by RT PCR: NEGATIVE
